# Patient Record
Sex: Male | Born: 1940 | Race: White | Hispanic: No | Marital: Married | Smoking: Former smoker
Health system: Southern US, Community
[De-identification: ages and names within clinical notes are randomized; demographics above are authoritative.]

## PROBLEM LIST (undated history)

## (undated) DIAGNOSIS — C801 Malignant (primary) neoplasm, unspecified: Secondary | ICD-10-CM

## (undated) DIAGNOSIS — I714 Abdominal aortic aneurysm, without rupture, unspecified: Secondary | ICD-10-CM

## (undated) DIAGNOSIS — I739 Peripheral vascular disease, unspecified: Secondary | ICD-10-CM

## (undated) DIAGNOSIS — I10 Essential (primary) hypertension: Secondary | ICD-10-CM

## (undated) DIAGNOSIS — I723 Aneurysm of iliac artery: Secondary | ICD-10-CM

## (undated) HISTORY — DX: Aneurysm of iliac artery: I72.3

## (undated) HISTORY — PX: SMALL INTESTINE SURGERY: SHX150

## (undated) HISTORY — DX: Essential (primary) hypertension: I10

---

## 2018-09-25 ENCOUNTER — Other Ambulatory Visit (HOSPITAL_COMMUNITY): Payer: Self-pay | Admitting: Internal Medicine

## 2018-09-25 DIAGNOSIS — I739 Peripheral vascular disease, unspecified: Secondary | ICD-10-CM

## 2018-10-01 ENCOUNTER — Ambulatory Visit (HOSPITAL_COMMUNITY)
Admission: RE | Admit: 2018-10-01 | Discharge: 2018-10-01 | Disposition: A | Discharge status: Home / Self Care | Payer: No Typology Code available for payment source | Source: Ambulatory Visit | Attending: Internal Medicine | Admitting: Internal Medicine

## 2018-10-01 DIAGNOSIS — I739 Peripheral vascular disease, unspecified: Secondary | ICD-10-CM

## 2018-10-01 DIAGNOSIS — I771 Stricture of artery: Secondary | ICD-10-CM | POA: Insufficient documentation

## 2019-01-12 ENCOUNTER — Encounter: Payer: Self-pay | Admitting: Internal Medicine

## 2019-03-16 ENCOUNTER — Ambulatory Visit: Payer: Non-veteran care | Admitting: Gastroenterology

## 2019-04-09 ENCOUNTER — Other Ambulatory Visit: Payer: Self-pay

## 2019-04-09 DIAGNOSIS — I723 Aneurysm of iliac artery: Secondary | ICD-10-CM

## 2019-04-14 ENCOUNTER — Ambulatory Visit (INDEPENDENT_AMBULATORY_CARE_PROVIDER_SITE_OTHER): Payer: No Typology Code available for payment source | Admitting: Vascular Surgery

## 2019-04-14 ENCOUNTER — Encounter: Payer: Self-pay | Admitting: Vascular Surgery

## 2019-04-14 ENCOUNTER — Other Ambulatory Visit: Payer: Self-pay | Admitting: Vascular Surgery

## 2019-04-14 ENCOUNTER — Other Ambulatory Visit: Payer: Self-pay

## 2019-04-14 ENCOUNTER — Ambulatory Visit (HOSPITAL_COMMUNITY)
Admission: RE | Admit: 2019-04-14 | Discharge: 2019-04-14 | Disposition: A | Discharge status: Home / Self Care | Payer: Non-veteran care | Source: Ambulatory Visit | Attending: Vascular Surgery | Admitting: Vascular Surgery

## 2019-04-14 DIAGNOSIS — I7102 Dissection of abdominal aorta: Secondary | ICD-10-CM

## 2019-04-14 DIAGNOSIS — I723 Aneurysm of iliac artery: Secondary | ICD-10-CM

## 2019-04-14 DIAGNOSIS — I714 Abdominal aortic aneurysm, without rupture, unspecified: Secondary | ICD-10-CM | POA: Insufficient documentation

## 2019-04-14 DIAGNOSIS — I70213 Atherosclerosis of native arteries of extremities with intermittent claudication, bilateral legs: Secondary | ICD-10-CM | POA: Diagnosis not present

## 2019-04-14 DIAGNOSIS — I70219 Atherosclerosis of native arteries of extremities with intermittent claudication, unspecified extremity: Secondary | ICD-10-CM | POA: Insufficient documentation

## 2019-04-14 DIAGNOSIS — I7100 Dissection of unspecified site of aorta: Secondary | ICD-10-CM | POA: Insufficient documentation

## 2019-04-14 NOTE — Progress Notes (Signed)
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  Patient states he has known about several aneurysms in his abdomen for years and has been followed at the [place].  He has been seen by Dr. Nicola Girt a vascular surgeon there with serial duplex imaging.   In review of records, in 2019 on duplex had ectatic infra-renal aorta at 2.7 cm, right common iliac was 2.3 cm, and left was 1.8 cm.  There was growth on duplex this year and CTA was ordered.  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  In addition he endorses bilateral lower extremity claudication symptoms.  He states he no longer smokes.  States he never had a heart attack.  States he is able to mow his grass with a riding mower.  States both of his legs hurt with claudication symptoms and he can walk about 100 feet before he has to stop.  ABI's at [facility] 0.53 right, 0.44 left.  He does take Plavix.   No abdominal or back pain.  Aneurysm is otherwise asymptomatic.    Previous abdominal surgery includes bilateral oblique abdominal incisions and he states one of these was for perforated bowel and the other was for some cancer surgery that he cannot specify.  Past Medical History:  Diagnosis Date  . Hypertension   . Iliac artery aneurysm [facility])     Past Surgical History:  Procedure Laterality Date  . SMALL INTESTINE SURGERY      Family  History  Family history unknown: Yes    SOCIAL HISTORY: Social History   Socioeconomic History  . Marital status: Unknown    Spouse name: Not on file  . Number of children: Not on file  . Years of education: Not on file  . Highest education level: Not on file  Occupational History  . Not on file  Social Needs  . Financial resource strain: Not on file  . Food insecurity:    Worry: Not on file    Inability: Not on file  . Transportation needs:    Medical: Not on file    Non-medical: Not on file  Tobacco Use  . Smoking status: Former Smoker    Types: Cigarettes    Last attempt to quit: 2010    Years since quitting: 10.4  . Smokeless tobacco: Never Used  Substance and Sexual Activity  . Alcohol use: Not Currently  . Drug use: Never  . Sexual activity: Not on file  Lifestyle  . Physical activity:    Days per week: Not on file    Minutes per session: Not on file  . Stress: Not on file  Relationships  . Social connections:    Talks on phone: Not on file    Gets together: Not on file    Attends religious service: Not on file    Active member of club or organization: Not  on file    Attends meetings of clubs or organizations: Not on file    Relationship status: Not on file  . Intimate partner violence:    Fear of current or ex partner: Not on file    Emotionally abused: Not on file    Physically abused: Not on file    Forced sexual activity: Not on file  Other Topics Concern  . Not on file  Social History Narrative  . Not on file    Allergies  Allergen Reactions  . Mevacor [Lovastatin]   . Zocor [Simvastatin]     Current Outpatient Medications  Medication Sig Dispense Refill  . clopidogrel (PLAVIX) 75 MG tablet Take 75 mg by mouth daily.    . hydrocortisone 2.5 % cream Apply topically daily.    Marland Kitchen lisinopril (ZESTRIL) 20 MG tablet Take 20 mg by mouth daily.    Marland Kitchen omeprazole (PRILOSEC) 40 MG capsule Take 40 mg by mouth daily.     No current  facility-administered medications for this visit.     REVIEW OF SYSTEMS:  [X]  denotes positive finding, [ ]  denotes negative finding Cardiac  Comments:  Chest pain or chest pressure:    Shortness of breath upon exertion:    Short of breath when lying flat:    Irregular heart rhythm:        Vascular    Pain in calf, thigh, or hip brought on by ambulation: x bilateral  Pain in feet at night that wakes you up from your sleep:     Blood clot in your veins:    Leg swelling:         Pulmonary    Oxygen at home:    Productive cough:     Wheezing:         Neurologic    Sudden weakness in arms or legs:     Sudden numbness in arms or legs:     Sudden onset of difficulty speaking or slurred speech:    Temporary loss of vision in one eye:     Problems with dizziness:         Gastrointestinal    Blood in stool:     Vomited blood:         Genitourinary    Burning when urinating:     Blood in urine:        Psychiatric    Major depression:         Hematologic    Bleeding problems:    Problems with blood clotting too easily:        Skin    Rashes or ulcers:        Constitutional    Fever or chills:      PHYSICAL EXAM: Vitals:   04/14/19 0830 04/14/19 0841  BP: (!) 172/71 (!) 171/67  Pulse: (!) 48 (!) 48  Resp: 18   Temp: (!) 97 F (36.1 C)   TempSrc: Oral   SpO2: 100%   Weight: 161 lb (73 kg)   Height: 5' 7.5" (1.715 m)     GENERAL: The patient is a well-nourished male, in no acute distress. The vital signs are documented above. CARDIAC: There is a regular rate and rhythm.  VASCULAR:  1+ bilateral radial pulses palpable 1+ bilateral femoral pulses palpable No tissue loss lower extremity DP signals bilaterally PULMONARY: There is good air exchange bilaterally without wheezing or rales. ABDOMEN: Soft and non-tender with normal pitched bowel sounds.  Bilateral oblique abdominal incisions. MUSCULOSKELETAL: There  are no major deformities or cyanosis. NEUROLOGIC:  No focal weakness or paresthesias are detected. SKIN: There are no ulcers or rashes noted. PSYCHIATRIC: The patient has a normal affect.  DATA:   I independently reviewed his CTA from the [place] that was sent by disc.  He appears to have chronic abdominal aortic dissection starting at the level of the left renal artery which is the lowest.  He has stenosis of bilateral renal arteries.  He has a small infrarenal abdominal aneurysm approximately 3 cm.  His left distal common iliac aneurysm measures about 3.4 cm and he has significant mural thrombus within the aneurysm (nearly full of thrombus) with a stenosis of his common iliac.  The aneurysm does extend into the proximal hypogastric on the left although the hypo-does appear occluded proximally (but there is a branch distally with some retrograde perfusion).  On the right he has a small 2.3 cm common iliac artery aneurysm.  Bilateral common femoral arteries are patent.  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  Assessment/Plan:  78 year old male with history of hypertension and hyperlipidemia that presents as referral from the [place] primarily for evaluation of a 3.4 cm left common iliac artery aneurysm.  Discussed that we typically offer repair for common iliac artery aneurysms greater than 3 to 3.5 cm given at that time the risk of rupture exceeds the risk of repair.  Discussed that I do think his aneurysm needs to be repaired.  He has a lot of pathologic findings on CTA including what appears to be a chronic infrarenal aortic dissection and a 3 cm infrarenal abdominal aortic aneurysm he also has a small 2.3 cm right common iliac aneurysm in addition to the larger left common iliac that is 3.4 cm.  This is all in the setting of bilateral lower extremity short distance claudication.  In review of the CT scan I do not see any endovascular options for him and I think he would require an open repair with a bifurcated graft.  I explained this to him  detail.  I think we need to get an echocardiogram to ensure that he has no findings that would prevent him from having open aortic surgery (no previous cardiac history).  I will have him come back and see me after the echocardiogram is done to talk to him and his wife in detail more about surgery to ensure that he has a good understanding.  I discussed steps of open aortic surgery to him in detail today.  I did quote him a 30% risk of a major complication and typical 7+ day hospital stay.  Very pleasant and look forward to taking care of Mr. Farrel.     Marty Heck, MD [facility] Office: [phone] Pager: [place]

## 2019-04-20 ENCOUNTER — Other Ambulatory Visit: Payer: Self-pay

## 2019-04-20 ENCOUNTER — Ambulatory Visit (HOSPITAL_COMMUNITY)
Admission: RE | Admit: 2019-04-20 | Discharge: 2019-04-20 | Disposition: A | Discharge status: Home / Self Care | Payer: Medicare Other | Source: Ambulatory Visit | Attending: Vascular Surgery | Admitting: Vascular Surgery

## 2019-04-20 DIAGNOSIS — I7102 Dissection of abdominal aorta: Secondary | ICD-10-CM | POA: Insufficient documentation

## 2019-04-20 NOTE — Progress Notes (Signed)
  Echocardiogram 2D Echocardiogram has been performed.  Ryan Jackson 04/20/2019, 2:19 PM

## 2019-04-28 ENCOUNTER — Encounter: Payer: Self-pay | Admitting: Vascular Surgery

## 2019-04-28 ENCOUNTER — Other Ambulatory Visit: Payer: Self-pay

## 2019-04-28 ENCOUNTER — Ambulatory Visit (INDEPENDENT_AMBULATORY_CARE_PROVIDER_SITE_OTHER): Payer: Self-pay | Admitting: Vascular Surgery

## 2019-04-28 ENCOUNTER — Encounter: Payer: Self-pay | Admitting: *Deleted

## 2019-04-28 VITALS — BP 173/69 | HR 51 | Temp 97.6°F | Resp 18 | Ht 67.0 in | Wt 167.0 lb

## 2019-04-28 DIAGNOSIS — I714 Abdominal aortic aneurysm, without rupture, unspecified: Secondary | ICD-10-CM

## 2019-04-28 DIAGNOSIS — I7102 Dissection of abdominal aorta: Secondary | ICD-10-CM

## 2019-04-28 DIAGNOSIS — I70213 Atherosclerosis of native arteries of extremities with intermittent claudication, bilateral legs: Secondary | ICD-10-CM

## 2019-04-28 DIAGNOSIS — I723 Aneurysm of iliac artery: Secondary | ICD-10-CM

## 2019-04-28 NOTE — Progress Notes (Signed)
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  He presents today to further discuss surgery with his wife after echocardiogram.  As previously noted, patient has known about several aneurysms in his abdomen for years and has been followed at the [place].  He has been seen by Dr. Nicola Girt a vascular surgeon there with serial duplex imaging.   In review of records, in 2019 on duplex had ectatic infra-renal aorta at 2.7 cm, right common iliac was 2.3 cm, and left was 1.8 cm.  There was growth on duplex this year and CTA was ordered.  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  In addition he endorses bilateral lower extremity claudication symptoms.  He states he no longer smokes.  States he never had a heart attack.  States he is able to mow his grass with a riding mower and can walk from the office to the parking lot without stopped.  States both of his legs hurt with claudication symptoms and he can walk about 100 feet before he has to stop.  ABI's at [facility] 0.53 right, 0.44 left.  He does take Plavix.   No abdominal or back pain on todays visit.  Aneurysm is otherwise asymptomatic.    Previous abdominal surgery includes bilateral oblique abdominal incisions and he states one of these was for suspected appendicitis and then they had to make an incision on the other side when learned he had  perforated bowel.  Past Medical History:  Diagnosis Date  . Hypertension   . Iliac artery aneurysm [facility])     Past Surgical History:  Procedure Laterality Date  . SMALL INTESTINE SURGERY      Family History  Family history unknown: Yes    SOCIAL HISTORY: Social History   Socioeconomic History  . Marital status: Unknown    Spouse name: Not on file  . Number of children: Not on file  . Years of education: Not on file  . Highest education level: Not on file  Occupational History  . Not on file  Social Needs  . Financial resource strain: Not on file  . Food insecurity    Worry: Not on file    Inability: Not on file  . Transportation needs    Medical: Not on file    Non-medical: Not on file  Tobacco Use  . Smoking status: Former Smoker    Types: Cigarettes    Quit date: 2010    Years since quitting: 10.4  . Smokeless tobacco: Never Used  Substance and Sexual Activity  . Alcohol use: Not Currently  . Drug use: Never  . Sexual activity: Not on file  Lifestyle  . Physical activity    Days per week: Not on file    Minutes per session: Not on file  . Stress: Not on file  Relationships  . Social connections  Talks on phone: Not on file    Gets together: Not on file    Attends religious service: Not on file    Active member of club or organization: Not on file    Attends meetings of clubs or organizations: Not on file    Relationship status: Not on file  . Intimate partner violence    Fear of current or ex partner: Not on file    Emotionally abused: Not on file    Physically abused: Not on file    Forced sexual activity: Not on file  Other Topics Concern  . Not on file  Social History Narrative  . Not on file    Allergies  Allergen Reactions  . Mevacor [Lovastatin]   . Zocor [Simvastatin]     Current Outpatient Medications  Medication Sig Dispense Refill  . clopidogrel (PLAVIX) 75 MG tablet Take 75 mg by mouth daily.    . hydrocortisone 2.5 % cream  Apply topically daily.    Marland Kitchen lisinopril (ZESTRIL) 20 MG tablet Take 20 mg by mouth daily.    Marland Kitchen omeprazole (PRILOSEC) 40 MG capsule Take 40 mg by mouth daily.     No current facility-administered medications for this visit.     REVIEW OF SYSTEMS:  [X]  denotes positive finding, [ ]  denotes negative finding Cardiac  Comments:  Chest pain or chest pressure:    Shortness of breath upon exertion:    Short of breath when lying flat:    Irregular heart rhythm:        Vascular    Pain in calf, thigh, or hip brought on by ambulation: x bilateral  Pain in feet at night that wakes you up from your sleep:     Blood clot in your veins:    Leg swelling:         Pulmonary    Oxygen at home:    Productive cough:     Wheezing:         Neurologic    Sudden weakness in arms or legs:     Sudden numbness in arms or legs:     Sudden onset of difficulty speaking or slurred speech:    Temporary loss of vision in one eye:     Problems with dizziness:         Gastrointestinal    Blood in stool:     Vomited blood:         Genitourinary    Burning when urinating:     Blood in urine:        Psychiatric    Major depression:         Hematologic    Bleeding problems:    Problems with blood clotting too easily:        Skin    Rashes or ulcers:        Constitutional    Fever or chills:      PHYSICAL EXAM: Vitals:   04/28/19 0913  BP: (!) 173/69  Pulse: (!) 51  Resp: 18  Temp: 97.6 F (36.4 C)  TempSrc: Temporal  SpO2: 100%  Weight: 167 lb (75.8 kg)  Height: 5\' 7"  (1.702 m)    GENERAL: The patient is a well-nourished male, in no acute distress. The vital signs are documented above. CARDIAC: There is a regular rate and rhythm.  VASCULAR:  1+ bilateral radial pulses palpable 1+ bilateral femoral pulses palpable No tissue loss lower extremity DP signals bilaterally PULMONARY: There is good air exchange bilaterally  without wheezing or rales. ABDOMEN: Soft and non-tender with  normal pitched bowel sounds.  Bilateral oblique abdominal incisions. MUSCULOSKELETAL: There are no major deformities or cyanosis. NEUROLOGIC: No focal weakness or paresthesias are detected. SKIN: There are no ulcers or rashes noted. PSYCHIATRIC: The patient has a normal affect.  DATA:   I independently reviewed his CTA from the [place] that was sent by disc.  He appears to have chronic abdominal aortic dissection starting at the level of the left renal artery which is the lowest.  He has stenosis of bilateral renal arteries.  He has a small infrarenal abdominal aneurysm approximately 3 cm.  His left distal common iliac aneurysm measures about 3.4 cm and he has significant mural thrombus within the aneurysm (nearly full of thrombus) with a stenosis of his common iliac.  The aneurysm does extend into the proximal hypogastric on the left although the hypo-does appear occluded proximally (but there is a branch distally with some retrograde perfusion).  On the right he has a small 2.3 cm common iliac artery aneurysm.  Bilateral common femoral arteries are patent.  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  Assessment/Plan:  78 year old male with history of hypertension and hyperlipidemia that presents as referral from the [place] primarily for evaluation of a 3.4 cm left common iliac artery aneurysm.  In addition, he has a chronic infrarenal aortic dissection and a 3 cm infrarenal abdominal aortic aneurysm he also has a small 2.3 cm right common iliac aneurysm in addition to the larger left common iliac that is 3.4 cm.  Discussed with him and his wife in further detail today that he has no endovascular options and requires open surgical repair.  My plan would be open aneurysm repair with a bifurcated graft likely to the left common femoral artery and right bifurcation of the external iliac and hypogastric.   Echocardiogram showed normal EF of 60-65% and no wall motion abnormalities.  He denies any  previous cardiac events.  He states no chest pain or shortness of breath.  A long discussion with him and his wife about the involved nature of this operation.  Discussed typically at least a 7-day hospital stay under normal circumstances including ICU stay.  I quoted him a greater than 30% risk of major complication including cardiac event, bleeding requiring return to the OR, renal failure ultimately needing dialysis etc.  He wants to proceed and states he has a clear understanding of the risks and benefits.  His wife wants to be present with him in the hospital during his recovery.  Discussed the current situation with COVID and [facility] not allowing visitors.  As a result we will look into July for a date when potentially the restrictions will allow her to visit.  Discussed the need to hold his Plavix 7 days before surgery and can continue baby aspirin.   Marty Heck, MD [facility] [place] Office: [phone] Pager: [place]

## 2019-05-14 ENCOUNTER — Other Ambulatory Visit: Payer: Self-pay | Admitting: *Deleted

## 2019-05-22 ENCOUNTER — Ambulatory Visit: Payer: Non-veteran care | Admitting: Gastroenterology

## 2019-05-22 ENCOUNTER — Encounter: Payer: Self-pay | Admitting: Internal Medicine

## 2019-05-22 ENCOUNTER — Telehealth: Payer: Self-pay | Admitting: Internal Medicine

## 2019-05-22 NOTE — Telephone Encounter (Signed)
Patient was a no show and letter sent  °

## 2019-05-26 NOTE — Pre-Procedure Instructions (Addendum)
Ryan Jackson  05/26/2019      [place] [place] [phone] [place] [address] Phone: [phone] Fax: [phone]    Your procedure is scheduled on July 27  Report to [facility] Entrance A at 5:30 A.M.  Call this number if you have problems the morning of surgery:  [phone]   Remember:  Do not eat or drink after midnight.      Take these medicines the morning of surgery with A SIP OF WATER :              Refresh eye drops if needed             Omeprazole (prilosec)             aspirin              7 days prior to surgery STOP taking Aleve, Naproxen, Ibuprofen, Motrin, Advil, Goody's, BC's, all herbal medications, fish oil, and all vitamins.                STOP PLAVIX July 19                  Do not wear jewelry.  Do not wear lotions, powders, or perfumes, or deodorant.  Do not shave 48 hours prior to surgery.  Men may shave face and neck.  Do not bring valuables to the hospital.  [facility] is not responsible for any belongings or valuables.  Contacts, dentures or bridgework may not be worn into surgery.  Leave your suitcase in the car.  After surgery it may be brought to your room.  For patients admitted to the hospital, discharge time will be determined by your treatment team.  Patients discharged the day of surgery will not be allowed to drive home.    Special instructions:   [place]- Preparing For Surgery  Before surgery, you can play an important role. Because skin is not sterile, your skin needs to be as free of germs as possible. You can reduce the number of germs on your skin by washing with CHG (chlorahexidine gluconate) Soap before surgery.  CHG is an antiseptic cleaner which kills germs and bonds with the skin to continue killing germs even after washing.    Oral Hygiene is also important to reduce your risk of infection.  Remember - BRUSH  YOUR TEETH THE MORNING OF SURGERY WITH YOUR REGULAR TOOTHPASTE  Please do not use if you have an allergy to CHG or antibacterial soaps. If your skin becomes reddened/irritated stop using the CHG.  Do not shave (including legs and underarms) for at least 48 hours prior to first CHG shower. It is OK to shave your face.  Please follow these instructions carefully.   1. Shower the NIGHT BEFORE SURGERY and the MORNING OF SURGERY with CHG.   2. If you chose to wash your hair, wash your hair first as usual with your normal shampoo.  3. After you shampoo, rinse your hair and body thoroughly to remove the shampoo.  4. Use CHG as you would any other liquid soap. You can apply CHG directly to the skin and wash gently with a scrungie or a clean washcloth.   5. Apply the CHG Soap to your body ONLY FROM THE NECK DOWN.  Do not use on open wounds or open sores. Avoid contact with your eyes, ears, mouth and genitals (private parts). Wash Face  and genitals (private parts)  with your normal soap.  6. Wash thoroughly, paying special attention to the area where your surgery will be performed.  7. Thoroughly rinse your body with warm water from the neck down.  8. DO NOT shower/wash with your normal soap after using and rinsing off the CHG Soap.  9. Pat yourself dry with a CLEAN TOWEL.  10. Wear CLEAN PAJAMAS to bed the night before surgery, wear comfortable clothes the morning of surgery  11. Place CLEAN SHEETS on your bed the night of your first shower and DO NOT SLEEP WITH PETS.    Day of Surgery:  Do not apply any deodorants/lotions.  Please wear clean clothes to the hospital/surgery center.   Remember to brush your teeth WITH YOUR REGULAR TOOTHPASTE.    Please read over the following fact sheets that you were given. Coughing and Deep Breathing, MRSA Information and Surgical Site Infection Prevention

## 2019-05-27 ENCOUNTER — Other Ambulatory Visit: Payer: Self-pay

## 2019-05-27 ENCOUNTER — Encounter (HOSPITAL_COMMUNITY)
Admission: RE | Admit: 2019-05-27 | Discharge: 2019-05-27 | Disposition: A | Discharge status: Home / Self Care | Payer: Medicare Other | Source: Ambulatory Visit | Attending: Vascular Surgery | Admitting: Vascular Surgery

## 2019-05-27 ENCOUNTER — Encounter (HOSPITAL_COMMUNITY): Payer: Self-pay

## 2019-05-27 DIAGNOSIS — Z01818 Encounter for other preprocedural examination: Secondary | ICD-10-CM | POA: Diagnosis present

## 2019-05-27 HISTORY — DX: Peripheral vascular disease, unspecified: I73.9

## 2019-05-27 HISTORY — DX: Abdominal aortic aneurysm, without rupture: I71.4

## 2019-05-27 HISTORY — DX: Malignant (primary) neoplasm, unspecified: C80.1

## 2019-05-27 HISTORY — DX: Abdominal aortic aneurysm, without rupture, unspecified: I71.40

## 2019-05-27 LAB — URINALYSIS, ROUTINE W REFLEX MICROSCOPIC
Bilirubin Urine: NEGATIVE
Glucose, UA: NEGATIVE mg/dL
Hgb urine dipstick: NEGATIVE
Ketones, ur: NEGATIVE mg/dL
Leukocytes,Ua: NEGATIVE
Nitrite: NEGATIVE
Protein, ur: NEGATIVE mg/dL
Specific Gravity, Urine: 1.02 (ref 1.005–1.030)
pH: 5 (ref 5.0–8.0)

## 2019-05-27 LAB — CBC
HCT: 42.9 % (ref 39.0–52.0)
Hemoglobin: 12.9 g/dL — ABNORMAL LOW (ref 13.0–17.0)
MCH: 26.4 pg (ref 26.0–34.0)
MCHC: 30.1 g/dL (ref 30.0–36.0)
MCV: 87.9 fL (ref 80.0–100.0)
Platelets: 149 10*3/uL — ABNORMAL LOW (ref 150–400)
RBC: 4.88 MIL/uL (ref 4.22–5.81)
RDW: 21.7 % — ABNORMAL HIGH (ref 11.5–15.5)
WBC: 6.4 10*3/uL (ref 4.0–10.5)
nRBC: 0 % (ref 0.0–0.2)

## 2019-05-27 LAB — BLOOD GAS, ARTERIAL
Acid-Base Excess: 0.2 mmol/L (ref 0.0–2.0)
Bicarbonate: 24.5 mmol/L (ref 20.0–28.0)
Drawn by: 421801
FIO2: 21
O2 Saturation: 96.6 %
Patient temperature: 98.6
pCO2 arterial: 41.1 mmHg (ref 32.0–48.0)
pH, Arterial: 7.393 (ref 7.350–7.450)
pO2, Arterial: 87.6 mmHg (ref 83.0–108.0)

## 2019-05-27 LAB — PREPARE RBC (CROSSMATCH)

## 2019-05-27 LAB — PROTIME-INR
INR: 1 (ref 0.8–1.2)
Prothrombin Time: 13.3 seconds (ref 11.4–15.2)

## 2019-05-27 LAB — COMPREHENSIVE METABOLIC PANEL
ALT: 11 U/L (ref 0–44)
AST: 21 U/L (ref 15–41)
Albumin: 4.2 g/dL (ref 3.5–5.0)
Alkaline Phosphatase: 101 U/L (ref 38–126)
Anion gap: 9 (ref 5–15)
BUN: 19 mg/dL (ref 8–23)
CO2: 22 mmol/L (ref 22–32)
Calcium: 9.2 mg/dL (ref 8.9–10.3)
Chloride: 106 mmol/L (ref 98–111)
Creatinine, Ser: 1.09 mg/dL (ref 0.61–1.24)
GFR calc Af Amer: >60 mL/min (ref >60)
GFR calc non Af Amer: >60 mL/min (ref >60)
Glucose, Bld: 100 mg/dL — ABNORMAL HIGH (ref 70–99)
Potassium: 4.3 mmol/L (ref 3.5–5.1)
Sodium: 137 mmol/L (ref 135–145)
Total Bilirubin: 1 mg/dL (ref 0.3–1.2)
Total Protein: 7.2 g/dL (ref 6.5–8.1)

## 2019-05-27 LAB — SURGICAL PCR SCREEN
MRSA, PCR: NEGATIVE
Staphylococcus aureus: NEGATIVE

## 2019-05-27 LAB — APTT: aPTT: 34 seconds (ref 24–36)

## 2019-05-27 LAB — ABO/RH: ABO/RH(D): O POS

## 2019-05-27 NOTE — Progress Notes (Signed)
PCP - [facility] Cardiologist - na  Chest x-ray - na EKG - today Stress Test - na ECHO - na Cardiac Cath - na  Sleep Study - na CPAP -   Fasting Blood Sugar - na Checks Blood Sugar _____ times a day  Blood Thinner Instructions: plavix stop 7/19 Aspirin Instructions: continue and take dos  Pt. Having covid testing  July 24 @ [facility]  Anesthesia review:   Patient denies shortness of breath, fever, cough and chest pain at PAT appointment   Patient verbalized understanding of instructions that were given to them at the PAT appointment. Patient was also instructed that they will need to review over the PAT instructions again at home before surgery.

## 2019-05-29 ENCOUNTER — Other Ambulatory Visit (HOSPITAL_COMMUNITY): Admission: RE | Admit: 2019-05-29 | Payer: Non-veteran care | Source: Ambulatory Visit

## 2019-05-29 ENCOUNTER — Encounter (HOSPITAL_COMMUNITY): Payer: Self-pay

## 2019-05-29 NOTE — Anesthesia Preprocedure Evaluation (Addendum)
Anesthesia Evaluation  Patient identified by MRN, date of birth, ID band Patient awake    Reviewed: Allergy & Precautions, NPO status , Patient's Chart, lab work & pertinent test results  Airway Mallampati: I  TM Distance: >3 FB Neck ROM: Full    Dental   Pulmonary former smoker,    Pulmonary exam normal        Cardiovascular hypertension, Pt. on medications Normal cardiovascular exam     Neuro/Psych    GI/Hepatic   Endo/Other    Renal/GU      Musculoskeletal   Abdominal   Peds  Hematology   Anesthesia Other Findings   Reproductive/Obstetrics                            Anesthesia Physical Anesthesia Plan  ASA: III  Anesthesia Plan: General   Post-op Pain Management:    Induction:   PONV Risk Score and Plan: 2 and Ondansetron and Treatment may vary due to age or medical condition  Airway Management Planned: Oral ETT  Additional Equipment: Arterial line, CVP, PA Cath and Ultrasound Guidance Line Placement  Intra-op Plan:   Post-operative Plan: Possible Post-op intubation/ventilation  Informed Consent: I have reviewed the patients History and Physical, chart, labs and discussed the procedure including the risks, benefits and alternatives for the proposed anesthesia with the patient or authorized representative who has indicated his/her understanding and acceptance.       Plan Discussed with: CRNA and Surgeon  Anesthesia Plan Comments: (See PAT note written 05/29/2019 by Myra Gianotti, PA-C. )       Anesthesia Quick Evaluation

## 2019-05-29 NOTE — Progress Notes (Signed)
Anesthesia Chart Review:  Case: [id number] Date/Time: 06/08/19 0715   Procedure: ANEURYSM ABDOMINAL AORTIC REPAIR (N/A )   Anesthesia type: General   Pre-op diagnosis: abdominal aortic aneurysm   Location: [facility] OR ROOM 12 / [place] [facility]   Surgeon: Marty Heck, MD      DISCUSSION: Patient is a 78 year old male scheduled for the above procedure. 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 He is not a candidate for endovascular approach given his anatomy. Dr. Carlis Abbott ordered a preoperative echo that showed normal LVF.   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   In review of his vitals and EKG, HR has been 47-59 BPM in [facility] since 04/14/19. Denied chest pain and SOB.  [facility] ROS negative other than claudication. No dizziness. There is no evidence of heart block on EKG. He is not on any rate-lowering medications. Will need close monitoring of his HR perioperatively. I have sent a message to Dr. Carlis Abbott regarding this in case he has any additional recommendations.  Per Dr. Carlis Abbott, hold Plavix for 7 days prior to surgery (last dose 05/31/19), but can continue ASA 81 mg. Presurgical COVID-19 test is scheduled for 06/05/19.   VS: BP (!) 159/69   Pulse (!) 59   Temp (!) 36.3 C   Resp 18   Ht 5\' 7"  (1.702 m)   Wt 74.5 kg   SpO2 99%   BMI 25.73 kg/m  HR 47-59 BPM on VS, EKGs in [facility] since 04/14/19.     PROVIDERS: He receives primary care through the [facility].   LABS: Labs reviewed: Acceptable for surgery. (all labs ordered are listed, but only abnormal results are displayed)  Labs Reviewed  CBC - Abnormal; Notable for the following  components:      Result Value   Hemoglobin 12.9 (*)    RDW 21.7 (*)    Platelets 149 (*)    All other components within normal limits  COMPREHENSIVE METABOLIC PANEL - Abnormal; Notable for the following components:   Glucose, Bld 100 (*)    All other components within normal limits  SURGICAL PCR SCREEN  APTT  BLOOD GAS, ARTERIAL  PROTIME-INR  URINALYSIS, ROUTINE W REFLEX MICROSCOPIC  PREPARE RBC (CROSSMATCH)  TYPE AND SCREEN  ABO/RH     IMAGES: [place] AAA Duplex 04/14/19: Summary: Abdominal Aorta: There is evidence of abnormal dilitation of the Distal Abdominal aorta. The largest aortic measurement is 3.1 cm.  There is evidence of abnormal dilation of the Right Common Iliac artery (largest measurement is 2.4 cm), Left Common Iliac artery (largest measurement of 2.2 cm), and Left External Iliac artery (largest measurement of 3.5 cm).  Significant thrombus is noted in the left common iliac and external iliac.   Mid aorta < 50% stenosis. No previous exam available for comparison.  CTA aorta with BLE runoff 02/23/19 [facility], read by Edgardo Roys, MD): Impression: 1.  Nonvascular: Findings most suspicious for peptic ulcer disease of the gastric pylorus or focal gastritis.  A gastric neoplasm or cholecystitis are differential considerations, but thought to be less likely.  No clear evidence of perforation.  Consider GI consultation. 2.  Abdominal aorta: Focal  dissection of the proximal infrarenal segment causing moderate stenosis.  Fusiform 3.0 x 2.5 cm aneurysm of the mid infrarenal.  Diffuse moderate atherosclerosis. 3.  Left leg: 3.4 x 3.0 cm eccentric/saccular aneurysm of the distal common iliac and proximal internal iliac.  It is not clear to me that this was within the area imaged on the prior 11/15/2017 ultrasound.  Aneurysm sac is predominantly thrombosed.  No surrounding fluid or fat stranding.  Common iliac is severely stenotic at the aneurysm.  Internal iliac is occluded proximally.   Remainder of the above-knee leg is severely diseased with focal severe stenosis in the external iliac, 13 cm long occlusion of the proximal SFA with severely stenotic distal SFA, and moderate disease in the above-the-knee popliteal.  Likely two-vessel runoff to the foot. 4.  Right leg: Stable 2.3 x 2.1 cm mild common iliac aneurysm.  Severely stenotic SFA with a few short segments of interspersed mild disease.  Moderate disease in the popliteal and severe disease in the tibioperoneal trunk.  Two-vessel runoff to the foot. 5.  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  Complete Abdominal Series + CHEST 12/23/18 [facility]): Findings:  The cardiomediastinal silhouette is within normal limits.  Bilateral nipple shadows.  Likely focal eventration of the right hemidiaphragm.  No pulmonary edema.  No large pleural effusion.  No definite focal lung consolidation seen.  No free air underlying the hemidiaphragms.  Rightward scoliotic curvature of the lumbar spine.  No dilated loops of the air-filled bowel to suggest a bowel obstruction.  Stool in the right colon, transverse colon, left colon, and rectum.  No definite large calcification seen projecting over the regions of the kidney, ureters, or urinary bladder.  Vascular calcifications. Impression: 1.  No definite acute cardiopulmonary abnormality identified. 2.  No dilated loops of air-filled bowel to suggest a bowel obstruction.  3.  No definite free intraperitoneal air seen. 4.  Consider CT of the abdomen pelvis for further evaluation as clinically warranted.   EKG: 05/27/19: Sinus bradycardia at 47 BPM Borderline ECG Confirmed by Kathlyn Sacramento [phone]) on 05/28/2019 7:35:09 AM   CV: Echo 04/20/19: IMPRESSIONS  1. The left ventricle has normal systolic function with an ejection fraction of 60-65%. The cavity size was normal. Left ventricular diastolic parameters were normal. No evidence of left ventricular regional  wall motion abnormalities.  2. The right ventricle has normal systolic function. The cavity was normal. There is no increase in right ventricular wall thickness.  3. Left atrial size was mildly dilated.  4. The interatrial septum appears to be lipomatous.    Past Medical History:  Diagnosis Date  . AAA (abdominal aortic aneurysm) (HCC)    3.0 cm with focal dissection at proximal infrarenal segment with moderate stenosis 02/2019  . Cancer ([place])    SKIN CNACER ON BACK  . Hypertension   . Iliac artery aneurysm ([place])   . PAD (peripheral artery disease) ([place])     Past Surgical History:  Procedure Laterality Date  . SMALL INTESTINE SURGERY      MEDICATIONS: . aspirin EC 81 MG tablet  . carboxymethylcellulose (REFRESH PLUS) 0.5 % SOLN  . clopidogrel (PLAVIX) 75 MG tablet  . diphenhydramine-acetaminophen (TYLENOL PM) 25-500 MG TABS tablet  . ferrous sulfate 325 (65 FE) MG tablet  . hydrocortisone 2.5 % cream  . lisinopril (ZESTRIL) 20 MG tablet  . naproxen sodium (ALEVE) 220 MG tablet  . omeprazole (PRILOSEC) 40 MG capsule   No current facility-administered  medications for this encounter.     Myra Gianotti, PA-C Surgical Short Stay/Anesthesiology [facility] Phone [phone] [facility] Phone [phone] 05/29/2019 6:30 PM

## 2019-06-05 ENCOUNTER — Other Ambulatory Visit (HOSPITAL_COMMUNITY)
Admission: RE | Admit: 2019-06-05 | Discharge: 2019-06-05 | Disposition: A | Discharge status: Home / Self Care | Payer: Medicare Other | Source: Ambulatory Visit | Attending: Vascular Surgery | Admitting: Vascular Surgery

## 2019-06-05 ENCOUNTER — Other Ambulatory Visit: Payer: Self-pay

## 2019-06-05 DIAGNOSIS — Z1159 Encounter for screening for other viral diseases: Secondary | ICD-10-CM | POA: Insufficient documentation

## 2019-06-06 LAB — SARS CORONAVIRUS 2 (TAT 6-24 HRS): SARS Coronavirus 2: NEGATIVE

## 2019-06-08 ENCOUNTER — Inpatient Hospital Stay (HOSPITAL_COMMUNITY): Payer: No Typology Code available for payment source

## 2019-06-08 ENCOUNTER — Inpatient Hospital Stay (HOSPITAL_COMMUNITY)
Admission: RE | Admit: 2019-06-08 | Discharge: 2019-06-16 | DRG: 270 | Disposition: A | Discharge status: Home Health | Payer: No Typology Code available for payment source | Attending: Vascular Surgery | Admitting: Vascular Surgery

## 2019-06-08 ENCOUNTER — Inpatient Hospital Stay (HOSPITAL_COMMUNITY): Payer: No Typology Code available for payment source | Admitting: Vascular Surgery

## 2019-06-08 ENCOUNTER — Other Ambulatory Visit: Payer: Self-pay

## 2019-06-08 ENCOUNTER — Inpatient Hospital Stay (HOSPITAL_COMMUNITY): Payer: No Typology Code available for payment source | Admitting: Anesthesiology

## 2019-06-08 ENCOUNTER — Encounter (HOSPITAL_COMMUNITY)
Admission: RE | Disposition: A | Discharge status: Home Health | Payer: Self-pay | Source: Home / Self Care | Attending: Vascular Surgery

## 2019-06-08 ENCOUNTER — Encounter (HOSPITAL_COMMUNITY): Payer: Self-pay | Admitting: Certified Registered Nurse Anesthetist

## 2019-06-08 DIAGNOSIS — I7102 Dissection of abdominal aorta: Secondary | ICD-10-CM

## 2019-06-08 DIAGNOSIS — D62 Acute posthemorrhagic anemia: Secondary | ICD-10-CM | POA: Diagnosis not present

## 2019-06-08 DIAGNOSIS — E872 Acidosis: Secondary | ICD-10-CM | POA: Diagnosis not present

## 2019-06-08 DIAGNOSIS — I714 Abdominal aortic aneurysm, without rupture, unspecified: Secondary | ICD-10-CM

## 2019-06-08 DIAGNOSIS — T8119XA Other postprocedural shock, initial encounter: Secondary | ICD-10-CM | POA: Diagnosis not present

## 2019-06-08 DIAGNOSIS — N179 Acute kidney failure, unspecified: Secondary | ICD-10-CM | POA: Diagnosis not present

## 2019-06-08 DIAGNOSIS — I70213 Atherosclerosis of native arteries of extremities with intermittent claudication, bilateral legs: Secondary | ICD-10-CM | POA: Diagnosis present

## 2019-06-08 DIAGNOSIS — I7100 Dissection of unspecified site of aorta: Secondary | ICD-10-CM | POA: Diagnosis present

## 2019-06-08 DIAGNOSIS — I723 Aneurysm of iliac artery: Secondary | ICD-10-CM | POA: Diagnosis present

## 2019-06-08 DIAGNOSIS — E785 Hyperlipidemia, unspecified: Secondary | ICD-10-CM | POA: Diagnosis present

## 2019-06-08 DIAGNOSIS — K660 Peritoneal adhesions (postprocedural) (postinfection): Secondary | ICD-10-CM | POA: Diagnosis present

## 2019-06-08 DIAGNOSIS — I480 Paroxysmal atrial fibrillation: Secondary | ICD-10-CM | POA: Diagnosis not present

## 2019-06-08 DIAGNOSIS — Z87891 Personal history of nicotine dependence: Secondary | ICD-10-CM

## 2019-06-08 DIAGNOSIS — I10 Essential (primary) hypertension: Secondary | ICD-10-CM | POA: Diagnosis present

## 2019-06-08 DIAGNOSIS — Z888 Allergy status to other drugs, medicaments and biological substances status: Secondary | ICD-10-CM

## 2019-06-08 DIAGNOSIS — Z8679 Personal history of other diseases of the circulatory system: Secondary | ICD-10-CM

## 2019-06-08 DIAGNOSIS — F05 Delirium due to known physiological condition: Secondary | ICD-10-CM | POA: Diagnosis not present

## 2019-06-08 DIAGNOSIS — Z419 Encounter for procedure for purposes other than remedying health state, unspecified: Secondary | ICD-10-CM

## 2019-06-08 HISTORY — PX: ABDOMINAL AORTIC ANEURYSM REPAIR: SHX42

## 2019-06-08 HISTORY — PX: LYSIS OF ADHESION: SHX5961

## 2019-06-08 LAB — BASIC METABOLIC PANEL
Anion gap: 14 (ref 5–15)
BUN: 15 mg/dL (ref 8–23)
CO2: 17 mmol/L — ABNORMAL LOW (ref 22–32)
Calcium: 6.8 mg/dL — ABNORMAL LOW (ref 8.9–10.3)
Chloride: 110 mmol/L (ref 98–111)
Creatinine, Ser: 1.24 mg/dL (ref 0.61–1.24)
GFR calc Af Amer: >60 mL/min (ref >60)
GFR calc non Af Amer: 55 mL/min — ABNORMAL LOW (ref >60)
Glucose, Bld: 209 mg/dL — ABNORMAL HIGH (ref 70–99)
Potassium: 4.4 mmol/L (ref 3.5–5.1)
Sodium: 141 mmol/L (ref 135–145)

## 2019-06-08 LAB — POCT I-STAT 7, (LYTES, BLD GAS, ICA,H+H)
Acid-base deficit: 13 mmol/L — ABNORMAL HIGH (ref 0.0–2.0)
Acid-base deficit: 8 mmol/L — ABNORMAL HIGH (ref 0.0–2.0)
Bicarbonate: 15.3 mmol/L — ABNORMAL LOW (ref 20.0–28.0)
Bicarbonate: 18.6 mmol/L — ABNORMAL LOW (ref 20.0–28.0)
Calcium, Ion: 1 mmol/L — ABNORMAL LOW (ref 1.15–1.40)
Calcium, Ion: 1.01 mmol/L — ABNORMAL LOW (ref 1.15–1.40)
HCT: 31 % — ABNORMAL LOW (ref 39.0–52.0)
HCT: 27 % — ABNORMAL LOW (ref 39.0–52.0)
Hemoglobin: 10.5 g/dL — ABNORMAL LOW (ref 13.0–17.0)
Hemoglobin: 9.2 g/dL — ABNORMAL LOW (ref 13.0–17.0)
O2 Saturation: 97 %
O2 Saturation: 95 %
Patient temperature: 33.9
Patient temperature: 36.2
Potassium: 3.8 mmol/L (ref 3.5–5.1)
Potassium: 3.5 mmol/L (ref 3.5–5.1)
Sodium: 144 mmol/L (ref 135–145)
Sodium: 145 mmol/L (ref 135–145)
TCO2: 17 mmol/L — ABNORMAL LOW (ref 22–32)
TCO2: 20 mmol/L — ABNORMAL LOW (ref 22–32)
pCO2 arterial: 36.8 mmHg (ref 32.0–48.0)
pCO2 arterial: 39.2 mmHg (ref 32.0–48.0)
pH, Arterial: 7.209 — ABNORMAL LOW (ref 7.350–7.450)
pH, Arterial: 7.279 — ABNORMAL LOW (ref 7.350–7.450)
pO2, Arterial: 101 mmHg (ref 83.0–108.0)
pO2, Arterial: 84 mmHg (ref 83.0–108.0)

## 2019-06-08 LAB — BLOOD GAS, ARTERIAL
Acid-base deficit: 7.2 mmol/L — ABNORMAL HIGH (ref 0.0–2.0)
Bicarbonate: 18.6 mmol/L — ABNORMAL LOW (ref 20.0–28.0)
O2 Content: 3 L/min
O2 Saturation: 98 %
Patient temperature: 98.6
pCO2 arterial: 42.3 mmHg (ref 32.0–48.0)
pH, Arterial: 7.265 — ABNORMAL LOW (ref 7.350–7.450)
pO2, Arterial: 133 mmHg — ABNORMAL HIGH (ref 83.0–108.0)

## 2019-06-08 LAB — MAGNESIUM: Magnesium: 1.7 mg/dL (ref 1.7–2.4)

## 2019-06-08 LAB — CBC
HCT: 36 % — ABNORMAL LOW (ref 39.0–52.0)
HCT: 27.7 % — ABNORMAL LOW (ref 39.0–52.0)
Hemoglobin: 11.1 g/dL — ABNORMAL LOW (ref 13.0–17.0)
Hemoglobin: 8.8 g/dL — ABNORMAL LOW (ref 13.0–17.0)
MCH: 28.1 pg (ref 26.0–34.0)
MCH: 28.4 pg (ref 26.0–34.0)
MCHC: 30.8 g/dL (ref 30.0–36.0)
MCHC: 31.8 g/dL (ref 30.0–36.0)
MCV: 91.1 fL (ref 80.0–100.0)
MCV: 89.4 fL (ref 80.0–100.0)
Platelets: 77 10*3/uL — ABNORMAL LOW (ref 150–400)
Platelets: 99 10*3/uL — ABNORMAL LOW (ref 150–400)
RBC: 3.95 MIL/uL — ABNORMAL LOW (ref 4.22–5.81)
RBC: 3.1 MIL/uL — ABNORMAL LOW (ref 4.22–5.81)
RDW: 19.1 % — ABNORMAL HIGH (ref 11.5–15.5)
RDW: 19.6 % — ABNORMAL HIGH (ref 11.5–15.5)
WBC: 11.8 10*3/uL — ABNORMAL HIGH (ref 4.0–10.5)
WBC: 10.5 10*3/uL (ref 4.0–10.5)
nRBC: 0 % (ref 0.0–0.2)
nRBC: 0 % (ref 0.0–0.2)

## 2019-06-08 LAB — PROTIME-INR
INR: 2.1 — ABNORMAL HIGH (ref 0.8–1.2)
INR: 1.5 — ABNORMAL HIGH (ref 0.8–1.2)
Prothrombin Time: 23.2 seconds — ABNORMAL HIGH (ref 11.4–15.2)
Prothrombin Time: 17.7 seconds — ABNORMAL HIGH (ref 11.4–15.2)

## 2019-06-08 LAB — APTT: aPTT: 62 seconds — ABNORMAL HIGH (ref 24–36)

## 2019-06-08 LAB — LACTIC ACID, PLASMA: Lactic Acid, Venous: 9.9 mmol/L (ref 0.5–1.9)

## 2019-06-08 SURGERY — ANEURYSM ABDOMINAL AORTIC REPAIR
Anesthesia: General

## 2019-06-08 MED ORDER — CEFAZOLIN SODIUM-DEXTROSE 2-4 GM/100ML-% IV SOLN
2.0000 g | INTRAVENOUS | Status: AC
  Administered 2019-06-08 (×2): 2 g via INTRAVENOUS
  Filled 2019-06-08: qty 100

## 2019-06-08 MED ORDER — ROCURONIUM BROMIDE 50 MG/5ML IV SOSY
PREFILLED_SYRINGE | INTRAVENOUS | Status: DC | PRN
  Administered 2019-06-08: 20 mg via INTRAVENOUS
  Administered 2019-06-08: 80 mg via INTRAVENOUS
  Administered 2019-06-08: 10 mg via INTRAVENOUS
  Administered 2019-06-08 (×2): 20 mg via INTRAVENOUS

## 2019-06-08 MED ORDER — EPHEDRINE SULFATE 50 MG/ML IJ SOLN
INTRAMUSCULAR | Status: DC | PRN
  Administered 2019-06-08 (×2): 10 mg via INTRAVENOUS
  Administered 2019-06-08: 5 mg via INTRAVENOUS
  Administered 2019-06-08: 10 mg via INTRAVENOUS
  Administered 2019-06-08: 5 mg via INTRAVENOUS
  Administered 2019-06-08: 10 mg via INTRAVENOUS
  Administered 2019-06-08 (×2): 5 mg via INTRAVENOUS
  Administered 2019-06-08: 10 mg via INTRAVENOUS
  Administered 2019-06-08 (×3): 5 mg via INTRAVENOUS
  Administered 2019-06-08 (×2): 10 mg via INTRAVENOUS
  Administered 2019-06-08: 5 mg via INTRAVENOUS
  Administered 2019-06-08: 10 mg via INTRAVENOUS
  Administered 2019-06-08: 5 mg via INTRAVENOUS
  Administered 2019-06-08: 10 mg via INTRAVENOUS

## 2019-06-08 MED ORDER — PHENYLEPHRINE HCL (PRESSORS) 10 MG/ML IV SOLN
INTRAVENOUS | Status: DC | PRN
  Administered 2019-06-08: 80 ug via INTRAVENOUS

## 2019-06-08 MED ORDER — HEPARIN SODIUM (PORCINE) 1000 UNIT/ML IJ SOLN
INTRAMUSCULAR | Status: DC | PRN
  Administered 2019-06-08: 8000 [IU] via INTRAVENOUS
  Administered 2019-06-08 (×2): 2000 [IU] via INTRAVENOUS

## 2019-06-08 MED ORDER — FUROSEMIDE 10 MG/ML IJ SOLN
INTRAMUSCULAR | Status: DC | PRN
  Administered 2019-06-08: 10 mg via INTRAMUSCULAR

## 2019-06-08 MED ORDER — SODIUM CHLORIDE 0.9% IV SOLUTION
Freq: Once | INTRAVENOUS | Status: AC
  Administered 2019-06-08: 18:00:00 via INTRAVENOUS

## 2019-06-08 MED ORDER — PANTOPRAZOLE SODIUM 40 MG PO TBEC
40.0000 mg | DELAYED_RELEASE_TABLET | Freq: Every day | ORAL | Status: DC
  Administered 2019-06-12 – 2019-06-15 (×4): 40 mg via ORAL
  Filled 2019-06-08 (×4): qty 1

## 2019-06-08 MED ORDER — SODIUM CHLORIDE 0.9% IV SOLUTION: Freq: Once | INTRAVENOUS | Status: DC

## 2019-06-08 MED ORDER — PANTOPRAZOLE SODIUM 40 MG IV SOLR
40.0000 mg | Freq: Every day | INTRAVENOUS | Status: DC
  Administered 2019-06-08 – 2019-06-11 (×4): 40 mg via INTRAVENOUS
  Filled 2019-06-08 (×4): qty 40

## 2019-06-08 MED ORDER — HEPARIN SODIUM (PORCINE) 1000 UNIT/ML IJ SOLN
INTRAMUSCULAR | Status: AC
  Filled 2019-06-08: qty 3

## 2019-06-08 MED ORDER — PHENYLEPHRINE 40 MCG/ML (10ML) SYRINGE FOR IV PUSH (FOR BLOOD PRESSURE SUPPORT)
PREFILLED_SYRINGE | INTRAVENOUS | Status: AC
  Filled 2019-06-08: qty 10

## 2019-06-08 MED ORDER — PHENOL 1.4 % MT LIQD: 1.0000 | OROMUCOSAL | Status: DC | PRN

## 2019-06-08 MED ORDER — LACTATED RINGERS IV SOLN
INTRAVENOUS | Status: DC | PRN
  Administered 2019-06-08 (×4): via INTRAVENOUS

## 2019-06-08 MED ORDER — ONDANSETRON HCL 4 MG/2ML IJ SOLN
INTRAMUSCULAR | Status: DC | PRN
  Administered 2019-06-08: 4 mg via INTRAVENOUS

## 2019-06-08 MED ORDER — ONDANSETRON HCL 4 MG/2ML IJ SOLN: 4.0000 mg | Freq: Four times a day (QID) | INTRAMUSCULAR | Status: DC | PRN

## 2019-06-08 MED ORDER — MANNITOL 25 % IV SOLN
INTRAVENOUS | Status: DC | PRN
  Administered 2019-06-08: 25 g via INTRAVENOUS

## 2019-06-08 MED ORDER — 0.9 % SODIUM CHLORIDE (POUR BTL) OPTIME
TOPICAL | Status: DC | PRN
  Administered 2019-06-08: 09:00:00 3000 mL

## 2019-06-08 MED ORDER — LABETALOL HCL 5 MG/ML IV SOLN: 10.0000 mg | INTRAVENOUS | Status: DC | PRN

## 2019-06-08 MED ORDER — CHLORHEXIDINE GLUCONATE CLOTH 2 % EX PADS: 6.0000 | MEDICATED_PAD | Freq: Once | CUTANEOUS | Status: DC

## 2019-06-08 MED ORDER — BISACODYL 10 MG RE SUPP: 10.0000 mg | Freq: Every day | RECTAL | Status: DC | PRN

## 2019-06-08 MED ORDER — PROTAMINE SULFATE 10 MG/ML IV SOLN
INTRAVENOUS | Status: DC | PRN
  Administered 2019-06-08: 20 mg via INTRAVENOUS
  Administered 2019-06-08: 10 mg via INTRAVENOUS

## 2019-06-08 MED ORDER — SODIUM BICARBONATE 8.4 % IV SOLN
INTRAVENOUS | Status: AC
  Administered 2019-06-08: 18:00:00 100 meq via INTRAVENOUS
  Filled 2019-06-08: qty 100

## 2019-06-08 MED ORDER — SODIUM CHLORIDE 0.9 % IV BOLUS: 1000.0000 mL | Freq: Once | INTRAVENOUS | Status: DC

## 2019-06-08 MED ORDER — ACETAMINOPHEN 325 MG PO TABS
325.0000 mg | ORAL_TABLET | ORAL | Status: DC | PRN
  Administered 2019-06-14: 650 mg via ORAL
  Filled 2019-06-08: qty 2

## 2019-06-08 MED ORDER — ORAL CARE MOUTH RINSE
15.0000 mL | Freq: Two times a day (BID) | OROMUCOSAL | Status: DC
  Administered 2019-06-08 – 2019-06-16 (×15): 15 mL via OROMUCOSAL

## 2019-06-08 MED ORDER — HYDROCORTISONE 2.5 % EX CREA: 1.0000 "application " | TOPICAL_CREAM | Freq: Every day | CUTANEOUS | Status: DC | PRN

## 2019-06-08 MED ORDER — MIDAZOLAM HCL 2 MG/2ML IJ SOLN
INTRAMUSCULAR | Status: AC
  Filled 2019-06-08: qty 2

## 2019-06-08 MED ORDER — EPHEDRINE 5 MG/ML INJ
INTRAVENOUS | Status: AC
  Filled 2019-06-08: qty 10

## 2019-06-08 MED ORDER — PROPOFOL 10 MG/ML IV BOLUS
INTRAVENOUS | Status: DC | PRN
  Administered 2019-06-08: 140 mg via INTRAVENOUS

## 2019-06-08 MED ORDER — ONDANSETRON HCL 4 MG/2ML IJ SOLN: 4.0000 mg | Freq: Once | INTRAMUSCULAR | Status: DC | PRN

## 2019-06-08 MED ORDER — ROCURONIUM BROMIDE 10 MG/ML (PF) SYRINGE
PREFILLED_SYRINGE | INTRAVENOUS | Status: AC
  Filled 2019-06-08: qty 40

## 2019-06-08 MED ORDER — POLYETHYLENE GLYCOL 3350 17 G PO PACK: 17.0000 g | PACK | Freq: Every day | ORAL | Status: DC | PRN

## 2019-06-08 MED ORDER — SODIUM CHLORIDE 0.9 % IV SOLN
500.0000 mL | Freq: Once | INTRAVENOUS | Status: AC | PRN
  Administered 2019-06-08: 500 mL via INTRAVENOUS

## 2019-06-08 MED ORDER — LACTATED RINGERS IV SOLN
INTRAVENOUS | Status: DC | PRN
  Administered 2019-06-08 (×2): via INTRAVENOUS

## 2019-06-08 MED ORDER — DOCUSATE SODIUM 100 MG PO CAPS
100.0000 mg | ORAL_CAPSULE | Freq: Every day | ORAL | Status: DC
  Administered 2019-06-11 – 2019-06-16 (×4): 100 mg via ORAL
  Filled 2019-06-08 (×4): qty 1

## 2019-06-08 MED ORDER — SODIUM CHLORIDE 0.9 % IV BOLUS
1000.0000 mL | Freq: Once | INTRAVENOUS | Status: AC
  Administered 2019-06-08: 1000 mL via INTRAVENOUS

## 2019-06-08 MED ORDER — SODIUM CHLORIDE 0.9 % IV SOLN
INTRAVENOUS | Status: DC | PRN
  Administered 2019-06-08: 12:00:00 via INTRAVENOUS
  Administered 2019-06-08: 08:00:00 25 ug/min via INTRAVENOUS

## 2019-06-08 MED ORDER — PANTOPRAZOLE SODIUM 40 MG IV SOLR: 40.0000 mg | Freq: Every day | INTRAVENOUS | Status: DC

## 2019-06-08 MED ORDER — ALBUMIN HUMAN 5 % IV SOLN
INTRAVENOUS | Status: DC | PRN
  Administered 2019-06-08 (×3): via INTRAVENOUS

## 2019-06-08 MED ORDER — SODIUM BICARBONATE 8.4 % IV SOLN
INTRAVENOUS | Status: DC | PRN
  Administered 2019-06-08 (×3): 50 meq via INTRAVENOUS

## 2019-06-08 MED ORDER — HYDRALAZINE HCL 20 MG/ML IJ SOLN: 5.0000 mg | INTRAMUSCULAR | Status: DC | PRN

## 2019-06-08 MED ORDER — OXYMETAZOLINE HCL 0.05 % NA SOLN
NASAL | Status: DC | PRN
  Administered 2019-06-08: 2 via NASAL

## 2019-06-08 MED ORDER — EPINEPHRINE 1 MG/10ML IJ SOSY
PREFILLED_SYRINGE | INTRAMUSCULAR | Status: DC | PRN
  Administered 2019-06-08: .1 mg via INTRAVENOUS

## 2019-06-08 MED ORDER — PROPOFOL 10 MG/ML IV BOLUS
INTRAVENOUS | Status: AC
  Filled 2019-06-08: qty 20

## 2019-06-08 MED ORDER — CHLORHEXIDINE GLUCONATE CLOTH 2 % EX PADS
6.0000 | MEDICATED_PAD | Freq: Every day | CUTANEOUS | Status: DC
  Administered 2019-06-09 – 2019-06-15 (×6): 6 via TOPICAL

## 2019-06-08 MED ORDER — SODIUM CHLORIDE 0.9 % IV SOLN
INTRAVENOUS | Status: AC
  Filled 2019-06-08: qty 1.2

## 2019-06-08 MED ORDER — ACETAMINOPHEN 325 MG RE SUPP: 325.0000 mg | RECTAL | Status: DC | PRN

## 2019-06-08 MED ORDER — FENTANYL CITRATE (PF) 250 MCG/5ML IJ SOLN
INTRAMUSCULAR | Status: AC
  Filled 2019-06-08: qty 5

## 2019-06-08 MED ORDER — MEPERIDINE HCL 25 MG/ML IJ SOLN: 6.2500 mg | INTRAMUSCULAR | Status: DC | PRN

## 2019-06-08 MED ORDER — CARBOXYMETHYLCELLULOSE SODIUM 0.5 % OP SOLN: 1.0000 [drp] | Freq: Three times a day (TID) | OPHTHALMIC | Status: DC | PRN

## 2019-06-08 MED ORDER — HYDROMORPHONE HCL 1 MG/ML IJ SOLN: 0.2500 mg | INTRAMUSCULAR | Status: DC | PRN

## 2019-06-08 MED ORDER — OXYMETAZOLINE HCL 0.05 % NA SOLN
NASAL | Status: AC
  Filled 2019-06-08: qty 30

## 2019-06-08 MED ORDER — LIDOCAINE 2% (20 MG/ML) 5 ML SYRINGE
INTRAMUSCULAR | Status: DC | PRN
  Administered 2019-06-08: 100 mg via INTRAVENOUS

## 2019-06-08 MED ORDER — PROTAMINE SULFATE 10 MG/ML IV SOLN
INTRAVENOUS | Status: AC
  Filled 2019-06-08: qty 5

## 2019-06-08 MED ORDER — POLYVINYL ALCOHOL 1.4 % OP SOLN: 1.0000 [drp] | Freq: Three times a day (TID) | OPHTHALMIC | Status: DC | PRN

## 2019-06-08 MED ORDER — GUAIFENESIN-DM 100-10 MG/5ML PO SYRP: 15.0000 mL | ORAL_SOLUTION | ORAL | Status: DC | PRN

## 2019-06-08 MED ORDER — HEMOSTATIC AGENTS (NO CHARGE) OPTIME
TOPICAL | Status: DC | PRN
  Administered 2019-06-08 (×2): 1 via TOPICAL

## 2019-06-08 MED ORDER — CEFAZOLIN SODIUM-DEXTROSE 2-4 GM/100ML-% IV SOLN
2.0000 g | Freq: Three times a day (TID) | INTRAVENOUS | Status: AC
  Administered 2019-06-08 – 2019-06-09 (×2): 2 g via INTRAVENOUS
  Filled 2019-06-08 (×3): qty 100

## 2019-06-08 MED ORDER — SODIUM CHLORIDE 0.9 % IV SOLN: INTRAVENOUS | Status: DC

## 2019-06-08 MED ORDER — SUGAMMADEX SODIUM 200 MG/2ML IV SOLN
INTRAVENOUS | Status: DC | PRN
  Administered 2019-06-08: 149 mg via INTRAVENOUS

## 2019-06-08 MED ORDER — MAGNESIUM SULFATE 2 GM/50ML IV SOLN
2.0000 g | Freq: Once | INTRAVENOUS | Status: AC | PRN
  Administered 2019-06-10: 2 g via INTRAVENOUS
  Filled 2019-06-08: qty 50

## 2019-06-08 MED ORDER — SODIUM BICARBONATE 8.4 % IV SOLN
100.0000 meq | Freq: Once | INTRAVENOUS | Status: AC
  Administered 2019-06-08: 100 meq via INTRAVENOUS

## 2019-06-08 MED ORDER — SODIUM CHLORIDE 0.9 % IV SOLN
INTRAVENOUS | Status: DC | PRN
  Administered 2019-06-08: 500 mL

## 2019-06-08 MED ORDER — LACTATED RINGERS IV SOLN
INTRAVENOUS | Status: DC | PRN
  Administered 2019-06-08 (×2): via INTRAVENOUS

## 2019-06-08 MED ORDER — FENTANYL CITRATE (PF) 250 MCG/5ML IJ SOLN
INTRAMUSCULAR | Status: DC | PRN
  Administered 2019-06-08 (×3): 50 ug via INTRAVENOUS
  Administered 2019-06-08: 100 ug via INTRAVENOUS
  Administered 2019-06-08: 400 ug via INTRAVENOUS

## 2019-06-08 MED ORDER — PANTOPRAZOLE SODIUM 40 MG PO TBEC: 40.0000 mg | DELAYED_RELEASE_TABLET | Freq: Every day | ORAL | Status: DC

## 2019-06-08 MED ORDER — ONDANSETRON HCL 4 MG/2ML IJ SOLN
INTRAMUSCULAR | Status: AC
  Filled 2019-06-08: qty 4

## 2019-06-08 MED ORDER — DEXTROSE-NACL 5-0.45 % IV SOLN
INTRAVENOUS | Status: DC
  Administered 2019-06-08 – 2019-06-09 (×3): via INTRAVENOUS
  Administered 2019-06-09: 23:00:00 125 mL/h via INTRAVENOUS
  Administered 2019-06-09 – 2019-06-10 (×4): via INTRAVENOUS
  Administered 2019-06-10: 125 mL/h via INTRAVENOUS
  Administered 2019-06-10: 21:00:00 via INTRAVENOUS
  Administered 2019-06-10: 125 mL/h via INTRAVENOUS
  Administered 2019-06-10 – 2019-06-14 (×5): via INTRAVENOUS

## 2019-06-08 MED ORDER — MORPHINE SULFATE (PF) 2 MG/ML IV SOLN
2.0000 mg | INTRAVENOUS | Status: DC | PRN
  Administered 2019-06-08 (×3): 2 mg via INTRAVENOUS
  Administered 2019-06-09: 4 mg via INTRAVENOUS
  Administered 2019-06-09: 16:00:00 2 mg via INTRAVENOUS
  Administered 2019-06-09 (×3): 4 mg via INTRAVENOUS
  Administered 2019-06-09: 2 mg via INTRAVENOUS
  Administered 2019-06-09: 4 mg via INTRAVENOUS
  Administered 2019-06-10: 2 mg via INTRAVENOUS
  Administered 2019-06-10: 4 mg via INTRAVENOUS
  Administered 2019-06-10 – 2019-06-15 (×7): 2 mg via INTRAVENOUS
  Filled 2019-06-08: qty 2
  Filled 2019-06-08 (×7): qty 1
  Filled 2019-06-08: qty 2
  Filled 2019-06-08 (×2): qty 1
  Filled 2019-06-08: qty 2
  Filled 2019-06-08: qty 1
  Filled 2019-06-08 (×3): qty 2
  Filled 2019-06-08: qty 1
  Filled 2019-06-08: qty 2

## 2019-06-08 MED ORDER — DEXAMETHASONE SODIUM PHOSPHATE 10 MG/ML IJ SOLN
INTRAMUSCULAR | Status: AC
  Filled 2019-06-08: qty 1

## 2019-06-08 MED ORDER — SODIUM CHLORIDE 0.9 % IV SOLN
INTRAVENOUS | Status: DC | PRN
  Administered 2019-06-08 (×2): via INTRAVENOUS

## 2019-06-08 MED ORDER — METOPROLOL TARTRATE 5 MG/5ML IV SOLN
2.5000 mg | Freq: Four times a day (QID) | INTRAVENOUS | Status: DC
  Administered 2019-06-08 – 2019-06-10 (×7): 2.5 mg via INTRAVENOUS
  Filled 2019-06-08 (×7): qty 5

## 2019-06-08 MED ORDER — LIDOCAINE 2% (20 MG/ML) 5 ML SYRINGE
INTRAMUSCULAR | Status: AC
  Filled 2019-06-08: qty 10

## 2019-06-08 MED ORDER — METOPROLOL TARTRATE 5 MG/5ML IV SOLN
2.0000 mg | INTRAVENOUS | Status: DC | PRN
  Administered 2019-06-10: 5 mg via INTRAVENOUS
  Filled 2019-06-08: qty 5

## 2019-06-08 MED ORDER — SODIUM CHLORIDE 0.9 % IV SOLN: INTRAVENOUS | Status: DC | PRN

## 2019-06-08 SURGICAL SUPPLY — 63 items
BLADE HEX COATED 2.75 (ELECTRODE) ×2 IMPLANT
CANISTER SUCT 3000ML PPV (MISCELLANEOUS) ×3 IMPLANT
CLIP VESOCCLUDE MED 24/CT (CLIP) ×5 IMPLANT
CLIP VESOCCLUDE SM WIDE 24/CT (CLIP) ×3 IMPLANT
COVER WAND RF STERILE (DRAPES) ×3 IMPLANT
DERMABOND ADVANCED (GAUZE/BANDAGES/DRESSINGS) ×6
DERMABOND ADVANCED .7 DNX12 (GAUZE/BANDAGES/DRESSINGS) IMPLANT
ELECT BLADE 4.0 EZ CLEAN MEGAD (MISCELLANEOUS) ×3
ELECT BLADE 6.5 EXT (BLADE) ×2 IMPLANT
ELECT REM PT RETURN 9FT ADLT (ELECTROSURGICAL) ×3
ELECTRODE BLDE 4.0 EZ CLN MEGD (MISCELLANEOUS) ×1 IMPLANT
ELECTRODE REM PT RTRN 9FT ADLT (ELECTROSURGICAL) ×1 IMPLANT
FELT TEFLON 1X6 (MISCELLANEOUS) ×3 IMPLANT
GLOVE BIO SURGEON STRL SZ 6.5 (GLOVE) ×6 IMPLANT
GLOVE BIO SURGEON STRL SZ7 (GLOVE) ×2 IMPLANT
GLOVE BIO SURGEON STRL SZ7.5 (GLOVE) ×5 IMPLANT
GLOVE BIO SURGEONS STRL SZ 6.5 (GLOVE) ×6
GLOVE BIOGEL PI IND STRL 6.5 (GLOVE) IMPLANT
GLOVE BIOGEL PI IND STRL 8 (GLOVE) ×1 IMPLANT
GLOVE BIOGEL PI INDICATOR 6.5 (GLOVE) ×14
GLOVE BIOGEL PI INDICATOR 8 (GLOVE) ×2
GLOVE SS BIOGEL STRL SZ 7.5 (GLOVE) IMPLANT
GLOVE SUPERSENSE BIOGEL SZ 7.5 (GLOVE) ×2
GLOVE SURG SS PI 6.5 STRL IVOR (GLOVE) ×6 IMPLANT
GOWN STRL NON-REIN LRG LVL3 (GOWN DISPOSABLE) ×4 IMPLANT
GOWN STRL REUS W/ TWL LRG LVL3 (GOWN DISPOSABLE) ×3 IMPLANT
GOWN STRL REUS W/ TWL XL LVL3 (GOWN DISPOSABLE) ×1 IMPLANT
GOWN STRL REUS W/TWL LRG LVL3 (GOWN DISPOSABLE) ×10
GOWN STRL REUS W/TWL XL LVL3 (GOWN DISPOSABLE) ×2
GRAFT HEMASHIELD 18X9MM (Vascular Products) ×2 IMPLANT
INSERT FOGARTY 61MM (MISCELLANEOUS) ×6 IMPLANT
INSERT FOGARTY SM (MISCELLANEOUS) ×12 IMPLANT
KIT BASIN OR (CUSTOM PROCEDURE TRAY) ×3 IMPLANT
KIT TURNOVER KIT B (KITS) ×3 IMPLANT
LOOP VESSEL MAXI BLUE (MISCELLANEOUS) ×2 IMPLANT
LOOP VESSEL MINI RED (MISCELLANEOUS) ×2 IMPLANT
NS IRRIG 1000ML POUR BTL (IV SOLUTION) ×8 IMPLANT
PACK AORTA (CUSTOM PROCEDURE TRAY) ×3 IMPLANT
PENCIL BUTTON HOLSTER BLD 10FT (ELECTRODE) ×2 IMPLANT
RETAINER VISCERA MED (MISCELLANEOUS) ×3 IMPLANT
SPONGE LAP 18X18 RF (DISPOSABLE) ×6 IMPLANT
STAPLER VISISTAT 35W (STAPLE) ×6 IMPLANT
SURGICEL SNOW 2X4 (HEMOSTASIS) ×4 IMPLANT
SUT MNCRL AB 3-0 PS2 18 (SUTURE) ×4 IMPLANT
SUT MNCRL AB 4-0 PS2 18 (SUTURE) ×6 IMPLANT
SUT PDS AB 1 TP1 96 (SUTURE) ×6 IMPLANT
SUT PROLENE 3 0 SH1 36 (SUTURE) ×11 IMPLANT
SUT PROLENE 5 0 C 1 24 (SUTURE) ×10 IMPLANT
SUT PROLENE 6 0 CC (SUTURE) ×22 IMPLANT
SUT SILK 2 0 (SUTURE) ×2
SUT SILK 2 0 SH CR/8 (SUTURE) ×3 IMPLANT
SUT SILK 2-0 18XBRD TIE 12 (SUTURE) IMPLANT
SUT SILK 3 0 (SUTURE) ×2
SUT SILK 3-0 18XBRD TIE 12 (SUTURE) IMPLANT
SUT SILK 4 0 (SUTURE) ×2
SUT SILK 4-0 18XBRD TIE 12 (SUTURE) IMPLANT
SUT VIC AB 2-0 CT1 36 (SUTURE) ×10 IMPLANT
SUT VIC AB 3-0 SH 27 (SUTURE) ×16
SUT VIC AB 3-0 SH 27X BRD (SUTURE) IMPLANT
TOWEL GREEN STERILE (TOWEL DISPOSABLE) ×3 IMPLANT
TOWEL SURG RFD BLUE STRL DISP (DISPOSABLE) ×4 IMPLANT
TRAY FOLEY MTR SLVR 16FR STAT (SET/KITS/TRAYS/PACK) ×3 IMPLANT
WATER STERILE IRR 1000ML POUR (IV SOLUTION) ×6 IMPLANT

## 2019-06-08 NOTE — Anesthesia Postprocedure Evaluation (Signed)
Anesthesia Post Note  Patient: Ryan Jackson  Procedure(s) Performed: OPEN ANEURYSM ABDOMINAL AORTIC REPAIR with Aorta Bifemoral Bypass Graft using Hemashield Gold Graft (N/A ) Extensive Lysis Of Adhesion (N/A )     Patient location during evaluation: PACU Anesthesia Type: General Level of consciousness: awake and alert Pain management: pain level controlled Vital Signs Assessment: post-procedure vital signs reviewed and stable Respiratory status: spontaneous breathing, nonlabored ventilation, respiratory function stable and patient connected to nasal cannula oxygen Cardiovascular status: blood pressure returned to baseline and stable Postop Assessment: no apparent nausea or vomiting Anesthetic complications: no    Last Vitals:  Vitals:   06/08/19 1518 06/08/19 1533  BP: 131/84 133/81  Pulse: 86 86  Resp: 15 12  Temp:    SpO2: 100% 100%    Last Pain:  Vitals:   06/08/19 1518  TempSrc:   PainSc: 0-No pain                 Aura Bibby DAVID

## 2019-06-08 NOTE — Progress Notes (Signed)
78 year old male status post open aneurysm repair today.  He has palpable femoral pulses in both groins.  There is no signs of hematoma.  He is very acidotic with a pH of 7.1.  Is also very cold and is still under a bear hugger.  Will will give him 2 units of FFP for an INR 2.1.  He will also get packed platelets for platelet count of 71.  We had AT Doppler signals in the PACU these are now hard to find but I think he needs ongoing resuscitation for his acidosis.  We will continue to monitor.  He states his feet feel normal.  He is motor and sensory intact.  Marty Heck, MD [facility] Office: [phone] Pager: [place]

## 2019-06-08 NOTE — Anesthesia Procedure Notes (Signed)
Arterial Line Insertion Start/End7/27/2020 7:03 AM, 06/08/2019 7:07 AM Performed by: Valda Favia, CRNA, CRNA  Preanesthetic checklist: patient identified, IV checked, site marked, risks and benefits discussed, surgical consent, monitors and equipment checked, pre-op evaluation, timeout performed and anesthesia consent Right, radial was placed Catheter size: 20 G Hand hygiene performed  and maximum sterile barriers used  Allen's test indicative of satisfactory collateral circulation Attempts: 1 Procedure performed without using ultrasound guided technique. Following insertion, dressing applied and Biopatch. Post procedure assessment: normal  Patient tolerated the procedure well with no immediate complications.

## 2019-06-08 NOTE — Anesthesia Procedure Notes (Signed)
Central Venous Catheter Insertion Performed by: Lillia Abed, MD, anesthesiologist Start/End7/27/2020 7:00 AM, 06/08/2019 7:15 AM Patient location: Pre-op. Preanesthetic checklist: patient identified, IV checked, risks and benefits discussed, surgical consent, monitors and equipment checked, pre-op evaluation, timeout performed and anesthesia consent Position: Trendelenburg Lidocaine 1% used for infiltration and patient sedated Hand hygiene performed  and maximum sterile barriers used  Catheter size: 8.5 Fr PA cath and Central line was placed.Sheath introducer Swan type:thermodilution Procedure performed using ultrasound guided technique. Ultrasound Notes:anatomy identified, needle tip was noted to be adjacent to the nerve/plexus identified, no ultrasound evidence of intravascular and/or intraneural injection and image(s) printed for medical record Attempts: 1 Following insertion, line sutured, dressing applied and Biopatch. Post procedure assessment: blood return through all ports, free fluid flow and no air  Patient tolerated the procedure well with no immediate complications.

## 2019-06-08 NOTE — Anesthesia Procedure Notes (Signed)
Procedure Name: Intubation Date/Time: 06/08/2019 7:53 AM Performed by: Glynda Jaeger, CRNA Pre-anesthesia Checklist: Patient identified, Patient being monitored, Timeout performed, Emergency Drugs available and Suction available Patient Re-evaluated:Patient Re-evaluated prior to induction Oxygen Delivery Method: Circle System Utilized Preoxygenation: Pre-oxygenation with 100% oxygen Induction Type: IV induction Ventilation: Mask ventilation without difficulty Laryngoscope Size: Mac and 4 Grade View: Grade I Tube type: Oral Tube size: 8.0 mm Number of attempts: 1 Airway Equipment and Method: Stylet Placement Confirmation: ETT inserted through vocal cords under direct vision,  positive ETCO2 and breath sounds checked- equal and bilateral Secured at: 23 cm Tube secured with: Tape Dental Injury: Teeth and Oropharynx as per pre-operative assessment

## 2019-06-08 NOTE — Discharge Instructions (Signed)
 [facility]  Discharge Instructions   Open Aortic Surgery  Please refer to the following instructions for your post-procedure care. Your surgeon or Physician Assistant will discuss any changes with you.  Activity  Avoid lifting more than eight pounds (a gallon of milk) until after your first post-operative visit. You are encouraged to walk as much as you can. You can slowly return to normal activities but must avoid strenuous activity and heavy lifting until your doctor tells you it's okay. Heavy lifting can hurt the incision and cause a hernia. Avoid activities such as vacuuming or swinging a golf club. It is normal to feel tired for several weeks after your surgery. Do not drive until your doctor gives the okay and you are no longer taking prescription pain medications. It is also normal to have difficulty with sleep habits, eating and bowl movements after surgery. These will go away with time.  Bathing/Showering  Shower daily after you go home. Do not soak in a bathtub, hot tub, or swim until the incision heals.  Incision Care  Shower every day. Clean your incision with mild soap and water. Pat the area dry with a clean towel. You do not need a bandage unless otherwise instructed. Do not apply any ointments or creams to your incision. You may have skin glue on your incision. Do not peel it off. It will come off on its own in about one week. If you have staples or sutures along your incision, they will be removed at your post op appointment.  If you have groin incisions, wash the groin wounds with soap and water daily and pat dry. (No tub bath-only shower)  Then put a dry gauze or washcloth in the groin to keep this area dry to help prevent wound infection.  Do this daily and as needed.  Do not use Vaseline or neosporin on your incisions.  Only use soap and water on your incisions and then protect and keep dry.  Diet  Resume your normal diet. There are no  special food restriction following this procedure. A low fat/low cholesterol diet is recommended for all patients with vascular disease. After your aortic surgery, it's normal to feel full faster than usual and to not feel as hungry as you normally would. You will probably lose weight initially following your surgery. It's best to eat small, frequent meals over the course of the day. Call the office if you find that you are unable to eat even small meals.   In order to heal from your surgery, it is CRITICAL to get adequate nutrition. Your body requires vitamins, minerals, and protein. Vegetables are the best source of vitamins and minerals. If you have pain, you may take over-the-counter pain reliever such as acetaminophen (Tylenol). If you were prescribed a stronger pain medication, please be aware these medication can cause nausea and constipation. Prevent nausea by taking the medication with a snack or meal. Avoid constipation by drinking plenty of fluids and eating foods with a high amount of fiber, such as fruits, vegetables and grains. Take 100mg of the over-the-counter stool softener Colace twice a day as needed to help with constipation. A laxative, such as Milk of Magnesia, may be recommended for you at this time. Do not take a laxative unless your surgeon or P.A. tells you it's OK.  Do not take Tylenol if you are taking stronger pain medications (such as Percocet).  Follow Up  Our office will schedule a follow up   appointment 2-3 weeks after discharge.  Please call us immediately for any of the following conditions    .     Severe or worsening pain in your legs or feet or in your abdomen back or chest. Increased pain, redness drainage (pus) from your incision site. Increased abdominal pain, bloating, nausea, vomiting, or persistent diarrhea. Fever of 101 degrees or higher. Swelling in your leg (s).  Reduce your risk of vascular disease  Stop smoking. If you would like help, call  [facility] at 1-800-QUIT-NOW (1-800-784-8669) or [place] at [phone]. Manage your cholesterol Maintain a desired weight Control your diabetes Keep your blood pressure down  If you have any questions please call the office at [phone].   

## 2019-06-08 NOTE — Op Note (Addendum)
Date: June 08, 2019  Preoperative diagnosis:  1.  Chronic infrarenal aortic dissection 2.  3 cm infrarenal aortic aneurysm 3.  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  Extensive lysis of abdominal adhesions 2.  Open abdominal aortic aneurysm repair with aorto bifemoral bypass (18 mm x 9 mm bifurcated Dacron graft) 3.  Right common femoral artery endarterectomy  Surgeon: Dr. Marty Heck, MD   Assistant: Curt Jews, MD and Leontine Locket, [place]  Indications: Patient is a 78 year old male who was recently seen in clinic and was referred from the [place] for a large left common iliac artery and hypogastric aneurysm.  He has been followed at the [facility] by Dr. Nicola Girt and on most recent surveillance imaging his left common iliac aneurysm was growing and a CT scan was obtained that confirmed a greater than 3.5 cm left common and hypogastric aneurysm.  He had significant other pathology including a chronic infrarenal aortic dissection, a small 3 similar infrarenal aneurysm, as well as a right 2.3 cm common iliac aneurysm.  In addition he has claudication symptoms with occlusive disease in his aortoiliac segments.  Discussed in detail with him and his wife that there were no endovascular options and he presents today for open aneurysm repair with planned aortobifemoral bypass after risks and benefits were discussed in detail.  Findings: A 18 mm x 9 mm bifurcated Dacron graft was sewn to the infrarenal aorta below the left renal artery end-to-end.  The chronic infra-renal dissection was fenestrated.  The IMA was ligated since there was no back bleeding.  The limbs were tunneled under each ureter and then sewn to each common femoral artery in each groin in end to side fashion.  The right common iliac aneurysm was  opened down to the bifurcation of the right external iliac and hypogastric artery where this was then oversewn just proximal to the bifurcation.  The right hypogastric artery should be perfused via retrograde perfusion of the right limb of the graft up the right external iliac artery.  I did confirm excellent doppler signal in the right hypogastric at the end of the case.  On the left the common as well as the hypogastric aneurysm were opened and several branches down the pelvis were ligated to prevent backbleeding and growth of the aneurysm so that it was completely excluded.  The left external iliac was then ligated at the takeoff.  It should be noted that the left hypogastric artery did appear occluded in the aneurysm with antegrade flow and only 1 or 2 small branches in the pelvis had some retrograde perfusion - these were ligated during the case.  The right common femoral artery required extensive endarterectomy.  There were anterior tibial artery signals at the end of the case in each leg.  Anesthesia: General  EBL: 2800 mL's  Resuscitation: 1300 mL Cell Saver, 5 L LR, 1 L normal saline, 2 units packed red blood cells, and several units of albumin  Details: The patient was taken to the operating room after informed consent was obtained.  He was placed on operative table in supine position.  His abdominal wall as well as both groins and both legs were prepped and draped in usual sterile fashion.  Preop timeout was performed after general anesthesia was induced in which the patient as well as the  procedure were identified.  Initially started out in the right groin where a vertical groin incision was made over his femoral pulse.  I dissected down with Bovie cautery through the subcutaneous tissue and identified inguinal ligament dissected down and bluntly dissected out the common femoral artery as well as SFA and profunda branches.  I dissected under the inguinal ligament and the circumflex vein was  identified and ligated between 3-0 silk ties clips and divided.  I was then able to tunnel over the right external iliac artery blunt ly with my finger.  I then performed the same procedure in the left groin again making a vertical groin incision dissecting out the common femoral as well as the profunda and SFA and Vesseloops were applied.  I did not see the circumflex vein branch on the left. Then turned my attention to the abdomen in a midline abdominal incision was made from just below the xiphoid all the way to the top of the pubis.  With Bovie cautery we carried dissection through the midline until we identified the fascia that was opened and then the peritoneal cavity was entered with blunt dissection.  From his previous several abdominal surgeries there was omentum that was stuck to the anterior abdominal wall.  Using a Metzenbaum scissors and Bovie cautery there was extensive lysis of adhesions.  Ultimately the omentum was then stuck to the small bowel and we had to lyse those adhesions in order to mobilize the small bowel as well as the transverse colon.  The small bowel was also stuck in both paracolic gutters and all of this was lysed with blunt dissection.  I also had to lyse the sigmoid colon off the small bowel in the left lower quadrant.  Once all this was freed up and the transverse colon was moved cephalad and the small bowel was eviscerated in the right upper quadrant.  I then took down retroperitoneum and mobilized the duodenum until the abdominal aneurysm could be identified.  The whole retroperitoneum was then opened down to the aortic bifurcation.  I used medium clips and met scissors and ligated any lymphatic vein branch or other vessel that identified.  Ultimately the left renal vein was mobilized and the left renal artery which was the lowest was identified and a vessel loop was placed around it.  I used blunt finger dissection as well as some Bovie cautery and the infrarenal aorta was  completely mobilized down to the aortic bifurcation.  I was careful to identify both the right and left ureter that were preserved.  Multiple lumbar branches were identified and medium vessel clips were placed to prevent backbleeding. Initially carried my dissection down the right common iliac where we identified the external and hypogastric artery that were each controlled with Vesseloops.  I then did the same on the left tunneling under the mesentery of the sigmoid colon and ultimately I had to flipped the sigmoid colon medial and then mobilized lateral to the sigmoid colon given the large left common and hypogastric aneurysm.  We opened a separate little window careful to preserve the ureter with the sigmoid colon was mobilized on the left.  Again I got a vessel loop around the left external iliac artery.  We then dug down in the pelvis and identified the hypogastric artery and there were several branches that were then encircled with a right angle and tied off with 2-0 silk ties to prevent any backbleeding into the aneurysm.  It should be noted that the antegrade  flow in the aneurysm appeared to be nonexistent in the aneurysm appeared nearly thrombosed.  At that point time I did put in umbilical tape behind the infrarenal aorta and positioned my aortic clamp in the infrarenal position.  At that point time the patient was given 100 units/kg heparin and ACT was checked to maintain greater than 250.  Should be noted that throughout the case the patient was incredibly oozy and all of his soft tissue was very oozy. At that point in time once we had a therapeutic ACT, I pulled up on my SFA and profunda clamps.  The infrarenal aorta was clamped.  I also pulled up on my vessel loop on the right hypogastric which was the only backbleeding hypogastric.  The aneurysm sac was then opened with 11 blade scalpel and extended with curved Mayo's.  Just below the clamp the aorta was transected and a limited endarterectomy was  performed here.  The aorta was full of thrombus and disease.  I then opened the aorta all the way down to the aortic bifurcation and ultimately did not have any brisk bleeding from any lumbars since these had already been ligated. A 18 mm x 9 mm bifurcated Dacron graft was brought on the field.  The main body was truncated.  I then used a 3-0 Prolene with a felt strip and a end-to-end anastomosis was sewn to the infrarenal aorta.  We did have to place several additional 3-0 sutures on the anterior wall with one pledget.  When we came off our clamp there was overall very good hemostasis at the proximal anastomosis.  When we flushed each limb the patient became profoundly hypotensive.  I suspected that he was behind on this morning resuscitation.  At that point time we reclamped the graft and anesthesia caught up given 2 units of blood he became more stable with additional volume. At that point time I then opened the left common iliac aneurysm onto the left hypogastric aneurysm.  All the thrombus was cleaned out.  There was no backbleeding from the distal hypogastric artery on the left.  The left external iliac was ligated just at its takeoff with several 2-0 silk ties and then a vessel clip was placed.  I then went down on the right side and open the common iliac artery all the way to the takeoff of the external iliac and hypogastric.  I preserved a small stump of distal right common iliac artery so that the right hypo-could get perfusion via retrograde from the right limb of the aortobifemoral graft.  I used a 3-0 Prolene and then ran a horizontal mattress suture down the back and then ligated this to prevent any backbleeding after the right common iliac artery been opened.  At that point time we then tunneled our grafts under both the right and left under the ureter and out into the right and left groins.  We already placed umbilical tape in her prepared tunnels. Dr. early started on the left groin where the SFA  profunda were clamped as well as a Henley clamp on the distal external iliac and then the artery was opened in longitudinal fashion 11 blade scalpel and Potts scissors.  An end-to-side anastomosis was then performed on the left with 5-0 Prolene. I started on the right and also pulled up on my loops on the SFA and profunda and a Henley on the distal external.  I opened up the right common femoral artery that was very heavily diseased and had a  large ulcerated plaque anterior.  Ultimately had to carry my dissection all the way down onto the SFA given a had no backbleeding initially and he had a fair amount SFA disease on the right.  I performed a endarterectomy on the right where I used a Building services engineer and then truncated the plaque in the common femoral artery and carried my dissection all the way down to the takeoff of the profunda where then truncated the plaque again to prevent a flap.  The distal flap was then tacked down with several 6-0 Prolenes.  I also tacked on the proximal flap since we needed retrograde perfuse in the right hypogastric artery.  At that point in time the right limb of the graft was then trimmed and spatulated and an end to  side anastomosis onto the right common femoral artery. When this was all complete we did not have any signals in the feet but I presume that he had dominant profunda runoff that he just needs to get warm and out of vasospasm.  The patient was given 40 mg of protamine for reversal.  Added hemostasis was obtained in the peritoneal cavity as well as in the retroperitoneum.  Around the retroperitoneum closed with a running 3-0 Vicryl to cover the graft.  There was really not enough aneurysm wall to get any coverage of the graft other than this very small segment of infrarenal aorta.  The peritoneal cavity was then washed out with saline until all the effluent was clear.  All the bowel looked healthy.  The Fascia was then run closed with #1  double-stranded PDS.  The skin was run closed with 3-0 Vicryl 4-0 Monocryl and Dermabond was applied.  I then went down and irrigated out both groins you Surgicel snow for hemostasis.  I ran a 2-0 Vicryl to reapproximate the femoral sheath in both groins and several layers of 3-0 Vicryl and 4-0 Monocryl in the skin.  Finally went down checked the feet again and he had AT signals in both feet there were monophasic.  I am hopeful that he will continue to get a better signal when he warms up and gets out of the operating room.  Complications: None  Condition: ICU  Marty Heck, MD [facility] Office: [phone] Pager: [place]

## 2019-06-08 NOTE — Progress Notes (Signed)
Patient a/ox3 but very forgetful and impulsive. Mitts placed on patient but was still able to pull out NG tube. Dr. Oneida Alar notified. Will not replace NG and will place on one to one sitter.  Low CVP, PAP numbers. Lactic acid at 9.9. Dr. Oneida Alar notified. ABG drawn. Will continue to monitor.

## 2019-06-08 NOTE — Progress Notes (Addendum)
[id number]  Patient arrived from PACU around 1630, temp was 33 degrees C, low MAPs, CVP 1-3, PAD 7s, CI 0.9. Bicarb in PACU 18.6. Distal pulses not heard with doppler in both legs. Concern for acidosis.  Standing orders for bolus 500 cc started and MD called and relayed the above information. 1L of fluid bolus ordered, 2 units of FFP ordered per MD.  PA to bedside to assess patient.  After assessment, expressed concern for acidosis. ABG revealed Bicarb 15, pH 7.1.    MD came to bedside x2.  After patient was warmed up some, patient had great distal dopplerable pulses in both ATs. Expressed results of ABG. 2 amps of HCO + 1L of fluid ordered.    Patient almost pulled NGT all the way out. NGT advanced and retaped.

## 2019-06-08 NOTE — Transfer of Care (Signed)
Immediate Anesthesia Transfer of Care Note  Patient: Ryan Jackson  Procedure(s) Performed: OPEN ANEURYSM ABDOMINAL AORTIC REPAIR with Aorta Bifemoral Bypass Graft using Hemashield Gold Graft (N/A ) Extensive Lysis Of Adhesion (N/A )  Patient Location: PACU  Anesthesia Type:General  Level of Consciousness: awake, patient cooperative and responds to stimulation  Airway & Oxygen Therapy: Patient Spontanous Breathing and Patient connected to nasal cannula oxygen  Post-op Assessment: Report given to RN, Post -op Vital signs reviewed and stable and Patient moving all extremities X 4  Post vital signs: Reviewed and stable  Last Vitals:  Vitals Value Taken Time  BP 135/107 06/08/19 1445  Temp    Pulse 87 06/08/19 1448  Resp 15 06/08/19 1448  SpO2 100 % 06/08/19 1448  Vitals shown include unvalidated device data.  Last Pain:  Vitals:   06/08/19 0628  TempSrc:   PainSc: 0-No pain         Complications: No apparent anesthesia complications

## 2019-06-08 NOTE — H&P (Signed)
History and Physical Interval Note:  06/08/2019 7:08 AM  Ryan Jackson  has presented today for surgery, with the diagnosis of abdominal aortic aneurysm.  The various methods of treatment have been discussed with the patient and family. After consideration of risks, benefits and other options for treatment, the patient has consented to  Procedure(s): ANEURYSM ABDOMINAL AORTIC REPAIR (N/A) as a surgical intervention.  The patient's history has been reviewed, patient examined, no change in status, stable for surgery.  I have reviewed the patient's chart and labs.  Questions were answered to the patient's satisfaction.    Open AAA repair - likely aortobifemoral bypass given chronic infra-renal aortic dissection with small aneurysm, left common iliac aneurysm and hypo aneurysm > 3.5 cm, and right common iliac aneurysm but also occlusive disease and claudication symptoms.  Marty Heck   REASON FOR CONSULT: Large left common iliac artery aneurysm (3.4 cm)  HPI: Ryan Jackson is a 78 y.o. 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  He presents today to further discuss surgery with his wife after echocardiogram.  As previously noted, patient has known about several aneurysms in his abdomen for years and has been followed at the [place].  He has been seen by Dr. Nicola Girt a vascular surgeon there with serial duplex imaging.   In review of records, in 2019 on duplex had ectatic infra-renal aorta at 2.7 cm, right common iliac was 2.3 cm, and left was 1.8 cm.  There was growth on duplex this year and CTA was ordered.  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  In addition he had a 3.4 cm distal left common iliac aneurysm with  involvement of the proximal hypogastric on that side.  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  In addition he endorses bilateral lower extremity claudication symptoms.  He states he no longer smokes.  States he never had a heart attack.  States he is able to mow his grass with a riding mower and can walk from the office to the parking lot without stopped.  States both of his legs hurt with claudication symptoms and he can walk about 100 feet before he has to stop.  ABI's at [facility] 0.53 right, 0.44 left.  He does take Plavix.   No abdominal or back pain on todays visit.  Aneurysm is otherwise asymptomatic.    Previous abdominal surgery includes bilateral oblique abdominal incisions and he states one of these was for suspected appendicitis and then they had to make an incision on the other side when learned he had perforated bowel.      Past Medical History:  Diagnosis Date  . Hypertension   . Iliac artery aneurysm [facility])          Past Surgical History:  Procedure Laterality Date  . SMALL INTESTINE SURGERY      Family History  Family history unknown: Yes    SOCIAL HISTORY: Social History        Socioeconomic History  . Marital status: Unknown    Spouse name: Not on file  . Number of children: Not on file  . Years of education: Not on file  . Highest education level: Not on file  Occupational History  .  Not on file  Social Needs  . Financial resource strain: Not on file  . Food insecurity    Worry: Not on file    Inability: Not on file  . Transportation needs    Medical: Not on file    Non-medical: Not on file  Tobacco Use  . Smoking status: Former Smoker    Types: Cigarettes    Quit date: 2010    Years since quitting: 10.4  . Smokeless tobacco: Never Used  Substance and Sexual Activity  . Alcohol use: Not Currently  . Drug use: Never  . Sexual activity: Not on file  Lifestyle  . Physical activity    Days per week: Not on  file    Minutes per session: Not on file  . Stress: Not on file  Relationships  . Social Herbalist on phone: Not on file    Gets together: Not on file    Attends religious service: Not on file    Active member of club or organization: Not on file    Attends meetings of clubs or organizations: Not on file    Relationship status: Not on file  . Intimate partner violence    Fear of current or ex partner: Not on file    Emotionally abused: Not on file    Physically abused: Not on file    Forced sexual activity: Not on file  Other Topics Concern  . Not on file  Social History Narrative  . Not on file        Allergies  Allergen Reactions  . Mevacor [Lovastatin]   . Zocor [Simvastatin]           Current Outpatient Medications  Medication Sig Dispense Refill  . clopidogrel (PLAVIX) 75 MG tablet Take 75 mg by mouth daily.    . hydrocortisone 2.5 % cream Apply topically daily.    Marland Kitchen lisinopril (ZESTRIL) 20 MG tablet Take 20 mg by mouth daily.    Marland Kitchen omeprazole (PRILOSEC) 40 MG capsule Take 40 mg by mouth daily.     No current facility-administered medications for this visit.     REVIEW OF SYSTEMS:  [X]  denotes positive finding, [ ]  denotes negative finding Cardiac  Comments:  Chest pain or chest pressure:    Shortness of breath upon exertion:    Short of breath when lying flat:    Irregular heart rhythm:        Vascular    Pain in calf, thigh, or hip brought on by ambulation: x bilateral  Pain in feet at night that wakes you up from your sleep:     Blood clot in your veins:    Leg swelling:         Pulmonary    Oxygen at home:    Productive cough:     Wheezing:         Neurologic    Sudden weakness in arms or legs:     Sudden numbness in arms or legs:     Sudden onset of difficulty speaking or slurred speech:    Temporary loss of vision in one eye:     Problems with dizziness:          Gastrointestinal    Blood in stool:     Vomited blood:         Genitourinary    Burning when urinating:     Blood in urine:        Psychiatric  Major depression:         Hematologic    Bleeding problems:    Problems with blood clotting too easily:        Skin    Rashes or ulcers:        Constitutional    Fever or chills:      PHYSICAL EXAM:    Vitals:   04/28/19 0913  BP: (!) 173/69  Pulse: (!) 51  Resp: 18  Temp: 97.6 F (36.4 C)  TempSrc: Temporal  SpO2: 100%  Weight: 167 lb (75.8 kg)  Height: 5\' 7"  (1.702 m)    GENERAL: The patient is a well-nourished male, in no acute distress. The vital signs are documented above. CARDIAC: There is a regular rate and rhythm.  VASCULAR:  1+ bilateral radial pulses palpable 1+ bilateral femoral pulses palpable No tissue loss lower extremity DP signals bilaterally PULMONARY: There is good air exchange bilaterally without wheezing or rales. ABDOMEN: Soft and non-tender with normal pitched bowel sounds.  Bilateral oblique abdominal incisions. MUSCULOSKELETAL: There are no major deformities or cyanosis. NEUROLOGIC: No focal weakness or paresthesias are detected. SKIN: There are no ulcers or rashes noted. PSYCHIATRIC: The patient has a normal affect.  DATA:   I independently reviewed his CTA from the [place] that was sent by disc.  He appears to have chronic abdominal aortic dissection starting at the level of the left renal artery which is the lowest.  He has stenosis of bilateral renal arteries.  He has a small infrarenal abdominal aneurysm approximately 3 cm.  His left distal common iliac aneurysm measures about 3.4 cm and he has significant mural thrombus within the aneurysm (nearly full of thrombus) with a stenosis of his common iliac.  The aneurysm does extend into the proximal hypogastric on the left although the hypo-does appear occluded proximally (but there is  a branch distally with some retrograde perfusion).  On the right he has a small 2.3 cm common iliac artery aneurysm.  Bilateral common femoral arteries are patent.  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  Assessment/Plan:  78 year old male with history of hypertension and hyperlipidemia that presents as referral from the [place] primarily for evaluation of a 3.4 cm left common iliac artery aneurysm.  In addition, he has a chronic infrarenal aortic dissection and a 3 cm infrarenal abdominal aortic aneurysm he also has a small 2.3 cm right common iliac aneurysm in addition to the larger left common iliac that is 3.4 cm.  Discussed with him and his wife in further detail today that he has no endovascular options and requires open surgical repair.  My plan would be open aneurysm repair with a bifurcated graft likely to the left common femoral artery and right bifurcation of the external iliac and hypogastric.   Echocardiogram showed normal EF of 60-65% and no wall motion abnormalities.  He denies any previous cardiac events.  He states no chest pain or shortness of breath.  A long discussion with him and his wife about the involved nature of this operation.  Discussed typically at least a 7-day hospital stay under normal circumstances including ICU stay.  I quoted him a greater than 30% risk of major complication including cardiac event, bleeding requiring return to the OR, renal failure ultimately needing dialysis etc.  He wants to proceed and states he has a clear understanding of the risks and benefits.  His wife wants to be present with him in the hospital during his recovery.  Discussed the current situation with COVID and [facility] not allowing visitors.  As a result we will look into July for a date when potentially the restrictions will allow her to visit.  Discussed the need to hold his Plavix 7 days before surgery and can continue baby aspirin.   Marty Heck,  MD [facility] Office: [phone] Pager: [phone]

## 2019-06-09 ENCOUNTER — Inpatient Hospital Stay (HOSPITAL_COMMUNITY): Payer: No Typology Code available for payment source

## 2019-06-09 ENCOUNTER — Encounter (HOSPITAL_COMMUNITY): Payer: Self-pay | Admitting: Vascular Surgery

## 2019-06-09 LAB — POCT I-STAT 7, (LYTES, BLD GAS, ICA,H+H)
Acid-base deficit: 3 mmol/L — ABNORMAL HIGH (ref 0.0–2.0)
Acid-base deficit: 5 mmol/L — ABNORMAL HIGH (ref 0.0–2.0)
Acid-base deficit: 8 mmol/L — ABNORMAL HIGH (ref 0.0–2.0)
Acid-base deficit: 11 mmol/L — ABNORMAL HIGH (ref 0.0–2.0)
Acid-base deficit: 4 mmol/L — ABNORMAL HIGH (ref 0.0–2.0)
Bicarbonate: 24.6 mmol/L (ref 20.0–28.0)
Bicarbonate: 22.4 mmol/L (ref 20.0–28.0)
Bicarbonate: 20.2 mmol/L (ref 20.0–28.0)
Bicarbonate: 19.5 mmol/L — ABNORMAL LOW (ref 20.0–28.0)
Bicarbonate: 17.2 mmol/L — ABNORMAL LOW (ref 20.0–28.0)
Bicarbonate: 21.6 mmol/L (ref 20.0–28.0)
Calcium, Ion: 1.2 mmol/L (ref 1.15–1.40)
Calcium, Ion: 1.13 mmol/L — ABNORMAL LOW (ref 1.15–1.40)
Calcium, Ion: 1.08 mmol/L — ABNORMAL LOW (ref 1.15–1.40)
Calcium, Ion: 0.99 mmol/L — ABNORMAL LOW (ref 1.15–1.40)
Calcium, Ion: 1.03 mmol/L — ABNORMAL LOW (ref 1.15–1.40)
Calcium, Ion: 1.07 mmol/L — ABNORMAL LOW (ref 1.15–1.40)
HCT: 36 % — ABNORMAL LOW (ref 39.0–52.0)
HCT: 32 % — ABNORMAL LOW (ref 39.0–52.0)
HCT: 22 % — ABNORMAL LOW (ref 39.0–52.0)
HCT: 32 % — ABNORMAL LOW (ref 39.0–52.0)
HCT: 33 % — ABNORMAL LOW (ref 39.0–52.0)
HCT: 27 % — ABNORMAL LOW (ref 39.0–52.0)
Hemoglobin: 12.2 g/dL — ABNORMAL LOW (ref 13.0–17.0)
Hemoglobin: 10.9 g/dL — ABNORMAL LOW (ref 13.0–17.0)
Hemoglobin: 7.5 g/dL — ABNORMAL LOW (ref 13.0–17.0)
Hemoglobin: 10.9 g/dL — ABNORMAL LOW (ref 13.0–17.0)
Hemoglobin: 11.2 g/dL — ABNORMAL LOW (ref 13.0–17.0)
Hemoglobin: 9.2 g/dL — ABNORMAL LOW (ref 13.0–17.0)
O2 Saturation: 100 %
O2 Saturation: 100 %
O2 Saturation: 100 %
O2 Saturation: 100 %
O2 Saturation: 100 %
O2 Saturation: 95 %
Patient temperature: 36.7
Potassium: 3.8 mmol/L (ref 3.5–5.1)
Potassium: 3.9 mmol/L (ref 3.5–5.1)
Potassium: 3.6 mmol/L (ref 3.5–5.1)
Potassium: 3.8 mmol/L (ref 3.5–5.1)
Potassium: 4.2 mmol/L (ref 3.5–5.1)
Potassium: 3.8 mmol/L (ref 3.5–5.1)
Sodium: 139 mmol/L (ref 135–145)
Sodium: 138 mmol/L (ref 135–145)
Sodium: 138 mmol/L (ref 135–145)
Sodium: 141 mmol/L (ref 135–145)
Sodium: 141 mmol/L (ref 135–145)
Sodium: 142 mmol/L (ref 135–145)
TCO2: 26 mmol/L (ref 22–32)
TCO2: 24 mmol/L (ref 22–32)
TCO2: 21 mmol/L — ABNORMAL LOW (ref 22–32)
TCO2: 21 mmol/L — ABNORMAL LOW (ref 22–32)
TCO2: 19 mmol/L — ABNORMAL LOW (ref 22–32)
TCO2: 23 mmol/L (ref 22–32)
pCO2 arterial: 41.1 mmHg (ref 32.0–48.0)
pCO2 arterial: 40.5 mmHg (ref 32.0–48.0)
pCO2 arterial: 37.3 mmHg (ref 32.0–48.0)
pCO2 arterial: 44.9 mmHg (ref 32.0–48.0)
pCO2 arterial: 46.9 mmHg (ref 32.0–48.0)
pCO2 arterial: 39.6 mmHg (ref 32.0–48.0)
pH, Arterial: 7.385 (ref 7.350–7.450)
pH, Arterial: 7.351 (ref 7.350–7.450)
pH, Arterial: 7.341 — ABNORMAL LOW (ref 7.350–7.450)
pH, Arterial: 7.246 — ABNORMAL LOW (ref 7.350–7.450)
pH, Arterial: 7.173 — CL (ref 7.350–7.450)
pH, Arterial: 7.342 — ABNORMAL LOW (ref 7.350–7.450)
pO2, Arterial: 190 mmHg — ABNORMAL HIGH (ref 83.0–108.0)
pO2, Arterial: 244 mmHg — ABNORMAL HIGH (ref 83.0–108.0)
pO2, Arterial: 316 mmHg — ABNORMAL HIGH (ref 83.0–108.0)
pO2, Arterial: 234 mmHg — ABNORMAL HIGH (ref 83.0–108.0)
pO2, Arterial: 227 mmHg — ABNORMAL HIGH (ref 83.0–108.0)
pO2, Arterial: 79 mmHg — ABNORMAL LOW (ref 83.0–108.0)

## 2019-06-09 LAB — BPAM PLATELET PHERESIS
Blood Product Expiration Date: 202007282359
ISSUE DATE / TIME: 202007272013
Unit Type and Rh: 6200

## 2019-06-09 LAB — COMPREHENSIVE METABOLIC PANEL
ALT: 15 U/L (ref 0–44)
ALT: 18 U/L (ref 0–44)
AST: 53 U/L — ABNORMAL HIGH (ref 15–41)
AST: 63 U/L — ABNORMAL HIGH (ref 15–41)
Albumin: 2.5 g/dL — ABNORMAL LOW (ref 3.5–5.0)
Albumin: 2.5 g/dL — ABNORMAL LOW (ref 3.5–5.0)
Alkaline Phosphatase: 40 U/L (ref 38–126)
Alkaline Phosphatase: 43 U/L (ref 38–126)
Anion gap: 15 (ref 5–15)
Anion gap: 11 (ref 5–15)
BUN: 16 mg/dL (ref 8–23)
BUN: 18 mg/dL (ref 8–23)
CO2: 17 mmol/L — ABNORMAL LOW (ref 22–32)
CO2: 21 mmol/L — ABNORMAL LOW (ref 22–32)
Calcium: 6.9 mg/dL — ABNORMAL LOW (ref 8.9–10.3)
Calcium: 6.9 mg/dL — ABNORMAL LOW (ref 8.9–10.3)
Chloride: 111 mmol/L (ref 98–111)
Chloride: 109 mmol/L (ref 98–111)
Creatinine, Ser: 1.54 mg/dL — ABNORMAL HIGH (ref 0.61–1.24)
Creatinine, Ser: 1.55 mg/dL — ABNORMAL HIGH (ref 0.61–1.24)
GFR calc Af Amer: 49 mL/min — ABNORMAL LOW (ref >60)
GFR calc Af Amer: 49 mL/min — ABNORMAL LOW (ref >60)
GFR calc non Af Amer: 43 mL/min — ABNORMAL LOW (ref >60)
GFR calc non Af Amer: 42 mL/min — ABNORMAL LOW (ref >60)
Glucose, Bld: 244 mg/dL — ABNORMAL HIGH (ref 70–99)
Glucose, Bld: 202 mg/dL — ABNORMAL HIGH (ref 70–99)
Potassium: 3.6 mmol/L (ref 3.5–5.1)
Potassium: 3.9 mmol/L (ref 3.5–5.1)
Sodium: 143 mmol/L (ref 135–145)
Sodium: 141 mmol/L (ref 135–145)
Total Bilirubin: 0.6 mg/dL (ref 0.3–1.2)
Total Bilirubin: 0.5 mg/dL (ref 0.3–1.2)
Total Protein: 4.1 g/dL — ABNORMAL LOW (ref 6.5–8.1)
Total Protein: 4 g/dL — ABNORMAL LOW (ref 6.5–8.1)

## 2019-06-09 LAB — CBC
HCT: 30.4 % — ABNORMAL LOW (ref 39.0–52.0)
HCT: 28.6 % — ABNORMAL LOW (ref 39.0–52.0)
HCT: 29.6 % — ABNORMAL LOW (ref 39.0–52.0)
Hemoglobin: 9.8 g/dL — ABNORMAL LOW (ref 13.0–17.0)
Hemoglobin: 9.3 g/dL — ABNORMAL LOW (ref 13.0–17.0)
Hemoglobin: 9.5 g/dL — ABNORMAL LOW (ref 13.0–17.0)
MCH: 28.4 pg (ref 26.0–34.0)
MCH: 28.4 pg (ref 26.0–34.0)
MCH: 28.3 pg (ref 26.0–34.0)
MCHC: 32.2 g/dL (ref 30.0–36.0)
MCHC: 32.5 g/dL (ref 30.0–36.0)
MCHC: 32.1 g/dL (ref 30.0–36.0)
MCV: 88.1 fL (ref 80.0–100.0)
MCV: 87.2 fL (ref 80.0–100.0)
MCV: 88.1 fL (ref 80.0–100.0)
Platelets: 93 10*3/uL — ABNORMAL LOW (ref 150–400)
Platelets: 80 10*3/uL — ABNORMAL LOW (ref 150–400)
Platelets: 80 10*3/uL — ABNORMAL LOW (ref 150–400)
RBC: 3.45 MIL/uL — ABNORMAL LOW (ref 4.22–5.81)
RBC: 3.28 MIL/uL — ABNORMAL LOW (ref 4.22–5.81)
RBC: 3.36 MIL/uL — ABNORMAL LOW (ref 4.22–5.81)
RDW: 19.9 % — ABNORMAL HIGH (ref 11.5–15.5)
RDW: 19.7 % — ABNORMAL HIGH (ref 11.5–15.5)
RDW: 20.2 % — ABNORMAL HIGH (ref 11.5–15.5)
WBC: 10.4 10*3/uL (ref 4.0–10.5)
WBC: 12.4 10*3/uL — ABNORMAL HIGH (ref 4.0–10.5)
WBC: 15.1 10*3/uL — ABNORMAL HIGH (ref 4.0–10.5)
nRBC: 0 % (ref 0.0–0.2)
nRBC: 0 % (ref 0.0–0.2)
nRBC: 0 % (ref 0.0–0.2)

## 2019-06-09 LAB — BPAM RBC
Blood Product Expiration Date: 202008242359
Blood Product Expiration Date: 202008242359
ISSUE DATE / TIME: 202007271108
ISSUE DATE / TIME: 202007271108
Unit Type and Rh: 5100
Unit Type and Rh: 5100

## 2019-06-09 LAB — AMYLASE: Amylase: 64 U/L (ref 28–100)

## 2019-06-09 LAB — TYPE AND SCREEN
ABO/RH(D): O POS
Antibody Screen: NEGATIVE
Unit division: 0
Unit division: 0

## 2019-06-09 LAB — LACTIC ACID, PLASMA
Lactic Acid, Venous: 8.6 mmol/L (ref 0.5–1.9)
Lactic Acid, Venous: 4.5 mmol/L (ref 0.5–1.9)
Lactic Acid, Venous: 3.8 mmol/L (ref 0.5–1.9)

## 2019-06-09 LAB — POCT ACTIVATED CLOTTING TIME
Activated Clotting Time: 252 seconds
Activated Clotting Time: 252 seconds
Activated Clotting Time: 142 seconds

## 2019-06-09 LAB — PREPARE PLATELET PHERESIS: Unit division: 0

## 2019-06-09 LAB — BPAM FFP
Blood Product Expiration Date: 202007302359
Blood Product Expiration Date: 202007302359
ISSUE DATE / TIME: 202007271646
ISSUE DATE / TIME: 202007271646
Unit Type and Rh: 600
Unit Type and Rh: 6200

## 2019-06-09 LAB — PREPARE FRESH FROZEN PLASMA
Unit division: 0
Unit division: 0

## 2019-06-09 LAB — MAGNESIUM: Magnesium: 1.5 mg/dL — ABNORMAL LOW (ref 1.7–2.4)

## 2019-06-09 NOTE — Addendum Note (Signed)
Addendum  created 06/09/19 1303 by Glynda Jaeger, CRNA   Charge Capture section accepted

## 2019-06-09 NOTE — Progress Notes (Signed)
Physical Therapy Evaluation Patient Details Name: Khoi Hamberger MRN: [id number] DOB: [birth year] Today's Date: 06/09/2019   History of Present Illness  Ryan Jackson is a 78 y.o. 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  He presents today to further discuss surgery with his wife after echocardiogram.  7/27 s/p lysis of abdominal adhesions, AAA repair with aortobifemoral bypass, R common femoral art endarterectomy.  Clinical Impression  Pt admitted with/for repair of AAA with aortobifemoral BPG and femoral art endarterectomy.  Pt is presently encephalopathic and unable to follow commands well or focus in therapy therapeutically.  This evaluation was limited and another mobility assessment is warranted..  Pt currently limited functionally due to the problems listed. ( See problems list.)   Pt will benefit from PT to maximize function and safety in order to get ready for next venue listed below.     Follow Up Recommendations SNF;Supervision/Assistance - 24 hour(but based on progress made, given he is usually Independent)    Equipment Recommendations  None recommended by PT;Other (comment)(TBA)    Recommendations for Other Services       Precautions / Restrictions Precautions Precautions: Fall Restrictions Weight Bearing Restrictions: No      Mobility  Bed Mobility               General bed mobility comments: OOB in chair upon arrival  Transfers Overall transfer level: Needs assistance               General transfer comment: attempted sit to stand from chair for repositioning but patient declined; +2 max assist to reposition in chair, as pt sliding foward   Ambulation/Gait             General Gait Details: per nursing staff, pt was able to ambulate with 2 nurses at an earlier time  prior to therapy evaluations, but pt was resistant to mobility at time o the evaluation.  Stairs            Wheelchair Mobility    Modified Rankin (Stroke Patients Only)       Balance Overall balance assessment: Needs assistance Sitting-balance support: No upper extremity supported;Feet supported Sitting balance-Leahy Scale: Fair                                       Pertinent Vitals/Pain Pain Assessment: Faces Faces Pain Scale: Hurts little more Pain Location: incisional (assumed) Pain Descriptors / Indicators: Operative site guarding;Guarding;Grimacing;Discomfort Pain Intervention(s): Limited activity within patient's tolerance    Home Living Family/patient expects to be discharged to:: Private residence Living Arrangements: Spouse/significant other Available Help at Discharge: Family Type of Home: House Home Access: Stairs to enter   Technical brewer of Steps: 2 Home Layout: One level        Prior Function Level of Independence: Independent         Comments: driving      Hand Dominance        Extremity/Trunk Assessment   Upper Extremity Assessment Upper Extremity Assessment: Generalized weakness    Lower Extremity Assessment Lower Extremity Assessment: Generalized weakness       Communication      Cognition Arousal/Alertness: Lethargic Behavior During Therapy: Flat affect;Agitated Overall Cognitive Status: No family/caregiver present to  determine baseline cognitive functioning                                 General Comments: pt oriented to name (not birthday, place, situation or time), inconsistently following commands, but noted pt with limited participation during sesson      General Comments General comments (skin integrity, edema, etc.): very limited eval, pt continuously reporting "I'm fine, don't worry about me"     Exercises     Assessment/Plan    PT Assessment Patient needs continued PT  services  PT Problem List Decreased activity tolerance;Decreased mobility;Decreased safety awareness;Decreased strength       PT Treatment Interventions Gait training;DME instruction;Functional mobility training;Therapeutic activities;Patient/family education    PT Goals (Current goals can be found in the Care Plan section)  Acute Rehab PT Goals Patient Stated Goal: none stated PT Goal Formulation: Patient unable to participate in goal setting Time For Goal Achievement: 06/23/19 Potential to Achieve Goals: Fair    Frequency Min 3X/week   Barriers to discharge        Co-evaluation PT/OT/SLP Co-Evaluation/Treatment: Yes Reason for Co-Treatment: Necessary to address cognition/behavior during functional activity PT goals addressed during session: Mobility/safety with mobility OT goals addressed during session: ADL's and self-care       AM-PAC PT "6 Clicks" Mobility  Outcome Measure Help needed turning from your back to your side while in a flat bed without using bedrails?: A Little Help needed moving from lying on your back to sitting on the side of a flat bed without using bedrails?: A Little Help needed moving to and from a bed to a chair (including a wheelchair)?: A Little Help needed standing up from a chair using your arms (e.g., wheelchair or bedside chair)?: A Lot Help needed to walk in hospital room?: A Little Help needed climbing 3-5 steps with a railing? : A Lot 6 Click Score: 16    End of Session   Activity Tolerance: Other (comment)(pt was resistant to any encouragement to mobilize) Patient left: in chair;with call bell/phone within reach;Other (comment)(repositioned) Nurse Communication: Mobility status PT Visit Diagnosis: Difficulty in walking, not elsewhere classified (R26.2);Other symptoms and signs involving the nervous system (R29.898);Other abnormalities of gait and mobility (R26.89)    Time: 1017-5102 PT Time Calculation (min) (ACUTE ONLY): 16  min   Charges:   PT Evaluation $PT Eval Moderate Complexity: 1 Mod          06/09/2019  Donnella Sham, PT Acute Rehabilitation Services [phone]  (pager) [phone]  (office)  Tessie Fass Rekia Kujala 06/09/2019, 6:21 PM

## 2019-06-09 NOTE — Evaluation (Addendum)
Occupational Therapy Evaluation Patient Details Name: Ryan Jackson MRN: [id number] DOB: Apr 01, 1941 Today's Date: 06/09/2019    History of Present Illness Ryan Jackson is a 78 y.o. 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  He presents today to further discuss surgery with his wife after echocardiogram.  7/27 s/p lysis of abdominal adhesions, AAA repair with aortobifemoral bypass, R common femoral art endarterectomy.   Clinical Impression   Patient admitted for above and limited by problem list below, including impaired cognition, decreased activity tolerance, impaired balance.  Patient oriented to his name only, inconsistently following commands; noted limited participation with pt declining to engage with therapists for mobility/ADLs.  Today, requires max assist +2 to reposition in recliner (as pt sliding out of chair).  Reviewed safety and role of therapy, pt perseverating on "I'm good, don't worry about me". Spoke to spouse, [name], who reports at baseline patient is independent and driving (although becomes confused at times).  Based on performance today, patient will benefit from further OT services while admitted and after dc at SNF rehab to maximize independence and safety with ADLs/mobility. Will continue to follow and update dc recommendations as needed, pending progress.     Follow Up Recommendations  SNF;Supervision/Assistance - 24 hour(pending progress/participation)    Equipment Recommendations  3 in 1 bedside commode    Recommendations for Other Services Speech consult     Precautions / Restrictions Precautions Precautions: Fall Restrictions Weight Bearing Restrictions: No      Mobility Bed Mobility               General bed mobility comments: OOB in chair upon  arrival  Transfers Overall transfer level: Needs assistance               General transfer comment: attempted sit to stand from chair for repositioning but patient declined; +2 max assist to reposition in chair, as pt sliding foward     Balance Overall balance assessment: Needs assistance Sitting-balance support: No upper extremity supported;Feet supported Sitting balance-Leahy Scale: Fair                                     ADL either performed or assessed with clinical judgement   ADL Overall ADL's : Needs assistance/impaired                                       General ADL Comments: pt requires total assist for ADLs at this time, pt unable to follow commands/refusing to participate during session      Vision   Additional Comments: further assessment required     Perception     Praxis      Pertinent Vitals/Pain Pain Assessment: Faces Faces Pain Scale: Hurts little more Pain Location: incisional (assumed) Pain Descriptors / Indicators: Operative site guarding;Guarding;Grimacing;Discomfort Pain Intervention(s): Monitored during session;Repositioned     Hand Dominance     Extremity/Trunk Assessment Upper Extremity Assessment Upper Extremity Assessment: Generalized weakness;Difficult to assess due to impaired cognition   Lower Extremity Assessment Lower Extremity Assessment: Defer to PT evaluation       Communication     Cognition Arousal/Alertness: Lethargic Behavior During Therapy: Flat  affect;Agitated Overall Cognitive Status: No family/caregiver present to determine baseline cognitive functioning                                 General Comments: pt oriented to name (not birthday, place, situation or time), inconsistently following commands, but noted pt with limited participation during sesson   General Comments  very limited eval, pt continuously reporting "I'm fine, don't worry about me"      Exercises     Shoulder Instructions      Home Living Family/patient expects to be discharged to:: Private residence Living Arrangements: Spouse/significant other Available Help at Discharge: Family Type of Home: House Home Access: Stairs to enter Technical brewer of Steps: 2   Home Layout: One level     Bathroom Shower/Tub: Walk-in shower                    Prior Functioning/Environment Level of Independence: Independent        Comments: driving         OT Problem List: Decreased strength;Impaired balance (sitting and/or standing);Decreased activity tolerance;Decreased cognition;Decreased safety awareness;Decreased coordination;Decreased range of motion;Decreased knowledge of use of DME or AE;Decreased knowledge of precautions;Cardiopulmonary status limiting activity;Pain;Obesity      OT Treatment/Interventions: Self-care/ADL training;DME and/or AE instruction;Therapeutic exercise;Therapeutic activities;Patient/family education;Balance training;Cognitive remediation/compensation    OT Goals(Current goals can be found in the care plan section) Acute Rehab OT Goals Patient Stated Goal: none stated OT Goal Formulation: Patient unable to participate in goal setting Time For Goal Achievement: 06/23/19 Potential to Achieve Goals: Fair  OT Frequency: Min 2X/week   Barriers to D/C:            Co-evaluation PT/OT/SLP Co-Evaluation/Treatment: Yes Reason for Co-Treatment: Necessary to address cognition/behavior during functional activity;For patient/therapist safety;To address functional/ADL transfers   OT goals addressed during session: ADL's and self-care      AM-PAC OT "6 Clicks" Daily Activity     Outcome Measure Help from another person eating meals?: Total Help from another person taking care of personal grooming?: Total Help from another person toileting, which includes using toliet, bedpan, or urinal?: Total Help from another person bathing  (including washing, rinsing, drying)?: Total Help from another person to put on and taking off regular upper body clothing?: Total Help from another person to put on and taking off regular lower body clothing?: Total 6 Click Score: 6   End of Session Nurse Communication: Mobility status  Activity Tolerance: Treatment limited secondary to agitation Patient left: in chair;with call bell/phone within reach;with nursing/sitter in room  OT Visit Diagnosis: Other abnormalities of gait and mobility (R26.89);Muscle weakness (generalized) (M62.81);Pain;Other symptoms and signs involving cognitive function Pain - part of body: (incisonal)                Time: 7829-5621 OT Time Calculation (min): 16 min Charges:  OT General Charges $OT Visit: 1 Visit OT Evaluation $OT Eval Moderate Complexity: 1 [place], OT Acute Rehabilitation Services Pager [phone] Office [phone]   Delight Stare 06/09/2019, 1:40 PM

## 2019-06-09 NOTE — Progress Notes (Signed)
[facility]  Subjective  - A little confused this morning.  Off all drips.  Brisk AT signals bilaterally.   Objective (!) 149/61 86 97.9 F (36.6 C) 14 100%  Intake/Output Summary (Last 24 hours) at 06/09/2019 0759 Last data filed at 06/09/2019 0600 Gross per 24 hour  Intake 14338.55 ml  Output 4005 ml  Net 10333.55 ml    Mottling on abdominal wall and both thighs improved Brisk AT signals bilaterally Feet motor and sensory intact Midline and groin incisions c/d/i  Laboratory Lab Results: Recent Labs    06/08/19 2049 06/08/19 2245 06/08/19 2358  WBC 10.5  --  10.4  HGB 8.8* 9.2* 9.8*  HCT 27.7* 27.0* 30.4*  PLT 99*  --  93*   BMET Recent Labs    06/08/19 1455  06/08/19 2245 06/08/19 2358  NA 141   < > 145 143  K 4.4   < > 3.5 3.6  CL 110  --   --  111  CO2 17*  --   --  17*  GLUCOSE 209*  --   --  244*  BUN 15  --   --  16  CREATININE 1.24  --   --  1.54*  CALCIUM 6.8*  --   --  6.9*   < > = values in this interval not displayed.    COAG Lab Results  Component Value Date   INR 1.5 (H) 06/08/2019   INR 2.1 (H) 06/08/2019   INR 1.0 05/27/2019   No results found for: PTT  Assessment/Planning: POD #1 s/p open AAA repair with aorto-bifemoral bypass.  N: GCS 14, a little confused this am, no other neuro deficits, moving legs well CV: No drips, Hgb 8.8 --> 9.8, no blood products overnight, got 2 units FFP and 1 unit platelets in ICU yesterday evening - INR 1.5 now from 2.1, platelets 93 on last check GI: NPO, pulled NG, will hold on replacing at this time, abdomen soft Renal: High risk for ATN given profound hypotension in OR, UOP 20 - 75 mL/hr, will monitor, IVF 125 mL today, last Cr bumped to 1.54, recheck this am, keep foley ID: Ancef for post-op prophylaxis FEN: IVF 125, lytes ok, monitoring acidosis, repeat gas and lactic acid this am Dispo:Keep in ICU today, will d/c swan ganz  Overall looks better today after  warming and resuscitation in ICU.  Brisk AT signals now and feet motor and sensory intact.  Will recheck labs this am to monitor Hgb and acidosis - profound hypotension in OR yesterday with acidosis - seems to be clinically improving.  Marty Heck 06/09/2019 7:59 AM --

## 2019-06-09 NOTE — Progress Notes (Signed)
Dr. Carlis Abbott paged regarding patient's progress for the day as well as new onset heel pain + low UOP. Give pain meds per orders.  Continue to monitor clinically.  AT pulses brisk with doppler.  Labs for morning ordered.  RN will continue to monitor.

## 2019-06-09 NOTE — Progress Notes (Signed)
CRITICAL VALUE ALERT  Critical Value: Lactic Acid 4.5   Date & Time Notied: 06/09/2019 0830   Orders Received/Actions taken: Expected value trending down

## 2019-06-10 DIAGNOSIS — I480 Paroxysmal atrial fibrillation: Secondary | ICD-10-CM

## 2019-06-10 LAB — CBC WITH DIFFERENTIAL/PLATELET
Abs Immature Granulocytes: 0.07 10*3/uL (ref 0.00–0.07)
Basophils Absolute: 0 10*3/uL (ref 0.0–0.1)
Basophils Relative: 0 %
Eosinophils Absolute: 0 10*3/uL (ref 0.0–0.5)
Eosinophils Relative: 0 %
HCT: 21.3 % — ABNORMAL LOW (ref 39.0–52.0)
Hemoglobin: 6.9 g/dL — CL (ref 13.0–17.0)
Immature Granulocytes: 1 %
Lymphocytes Relative: 8 %
Lymphs Abs: 0.5 10*3/uL — ABNORMAL LOW (ref 0.7–4.0)
MCH: 28.9 pg (ref 26.0–34.0)
MCHC: 32.4 g/dL (ref 30.0–36.0)
MCV: 89.1 fL (ref 80.0–100.0)
Monocytes Absolute: 0.4 10*3/uL (ref 0.1–1.0)
Monocytes Relative: 6 %
Neutro Abs: 6 10*3/uL (ref 1.7–7.7)
Neutrophils Relative %: 85 %
Platelets: 53 10*3/uL — ABNORMAL LOW (ref 150–400)
RBC: 2.39 MIL/uL — ABNORMAL LOW (ref 4.22–5.81)
RDW: 19.9 % — ABNORMAL HIGH (ref 11.5–15.5)
WBC: 7 10*3/uL (ref 4.0–10.5)
nRBC: 0 % (ref 0.0–0.2)

## 2019-06-10 LAB — COMPREHENSIVE METABOLIC PANEL
ALT: 17 U/L (ref 0–44)
AST: 75 U/L — ABNORMAL HIGH (ref 15–41)
Albumin: 2.4 g/dL — ABNORMAL LOW (ref 3.5–5.0)
Alkaline Phosphatase: 49 U/L (ref 38–126)
Anion gap: 9 (ref 5–15)
BUN: 23 mg/dL (ref 8–23)
CO2: 24 mmol/L (ref 22–32)
Calcium: 7.2 mg/dL — ABNORMAL LOW (ref 8.9–10.3)
Chloride: 107 mmol/L (ref 98–111)
Creatinine, Ser: 1.63 mg/dL — ABNORMAL HIGH (ref 0.61–1.24)
GFR calc Af Amer: 46 mL/min — ABNORMAL LOW (ref >60)
GFR calc non Af Amer: 40 mL/min — ABNORMAL LOW (ref >60)
Glucose, Bld: 125 mg/dL — ABNORMAL HIGH (ref 70–99)
Potassium: 4 mmol/L (ref 3.5–5.1)
Sodium: 140 mmol/L (ref 135–145)
Total Bilirubin: 0.7 mg/dL (ref 0.3–1.2)
Total Protein: 4.3 g/dL — ABNORMAL LOW (ref 6.5–8.1)

## 2019-06-10 LAB — LACTIC ACID, PLASMA: Lactic Acid, Venous: 3.3 mmol/L (ref 0.5–1.9)

## 2019-06-10 LAB — CBC
HCT: 25.7 % — ABNORMAL LOW (ref 39.0–52.0)
Hemoglobin: 8.2 g/dL — ABNORMAL LOW (ref 13.0–17.0)
MCH: 28.4 pg (ref 26.0–34.0)
MCHC: 31.9 g/dL (ref 30.0–36.0)
MCV: 88.9 fL (ref 80.0–100.0)
Platelets: 68 10*3/uL — ABNORMAL LOW (ref 150–400)
RBC: 2.89 MIL/uL — ABNORMAL LOW (ref 4.22–5.81)
RDW: 20.1 % — ABNORMAL HIGH (ref 11.5–15.5)
WBC: 12.6 10*3/uL — ABNORMAL HIGH (ref 4.0–10.5)
nRBC: 0 % (ref 0.0–0.2)

## 2019-06-10 MED ORDER — DILTIAZEM HCL 25 MG/5ML IV SOLN
10.0000 mg | Freq: Once | INTRAVENOUS | Status: DC
  Filled 2019-06-10: qty 5

## 2019-06-10 MED ORDER — METOPROLOL TARTRATE 5 MG/5ML IV SOLN
5.0000 mg | Freq: Four times a day (QID) | INTRAVENOUS | Status: DC
  Administered 2019-06-10 – 2019-06-15 (×16): 5 mg via INTRAVENOUS
  Filled 2019-06-10 (×16): qty 5

## 2019-06-10 NOTE — Consult Note (Addendum)
Cardiology Consultation:   Patient ID: Ryan Jackson MRN: [id number]; DOB: Oct 23, 1941  Admit date: 06/08/2019 Date of Consult: 06/10/2019  Primary Care Provider: [facility] Primary Cardiologist: No primary care provider on file.  Primary Electrophysiologist:  None    Patient Profile:   Ryan Jackson is a 78 y.o. male with a hx of HTN, HL, tobacco use and Abdominal iliac AA  who is being seen today for the evaluation of Afib at the request of Dr. Carlis Abbott.  History of Present Illness:   Ryan Jackson is a 78 yo male with PMH noted above. He is followed by the [place]. Does not have a cardiologist there. 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 He did have bilateral lower extremity claudication. ABI's at the [place] reported 0.53 on the right and 0.44 left. Echo done 04/20/19 showed normal EF with no rWMA. At his office visit options were discussed and patient opted for surgical repair.   Presented for surgery on 06/08/19 with Dr. Carlis Abbott complicated by profound hypotension. Post op was noted to be acidotic with pH of 7.1. He was given 2 units of FFP for INR of 2.1, along with platelets for platelet count of 71,000. Able to wean from all drips on 7/28. Developed some confusion the following day but no neuro deficits per notes. Seen by [facility] this morning and felt to be improving overall. This afternoon he developed Afib RVR on telemetry starting around 2pm. Was given 5mg  lopressor, initially did not resolve. Additional Dilt 10mg  IV push ordered, but converted to ST prior to administration. Patient is a poor historian, currently confused. During episode of Afib rates increased to 170s. Staff reports he did reach for his chest during this episode but did not complain of any specific symptoms. Current in sinus tach with rates in the 110s.   Heart Pathway Score:     Past Medical History:  Diagnosis  Date  . AAA (abdominal aortic aneurysm) (HCC)    3.0 cm with focal dissection at proximal infrarenal segment with moderate stenosis 02/2019  . Cancer ([place])    SKIN CNACER ON BACK  . Hypertension   . 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 PAD (peripheral artery disease) ([place])     Past Surgical History:  Procedure Laterality Date  . ABDOMINAL AORTIC ANEURYSM REPAIR N/A 06/08/2019   Procedure: OPEN ANEURYSM ABDOMINAL AORTIC REPAIR with Aorta Bifemoral Bypass Graft using Hemashield Gold Graft;  Surgeon: Marty Heck, MD;  Location: [facility];  Service: Vascular;  Laterality: N/A;  . LYSIS OF ADHESION N/A 06/08/2019   Procedure: Extensive Lysis Of Adhesion;  Surgeon: Marty Heck, MD;  Location: [facility];  Service: Vascular;  Laterality: N/A;  . SMALL INTESTINE SURGERY       Home Medications:  Prior to Admission medications   Medication Sig Start Date End Date Taking? Authorizing Provider  aspirin EC 81 MG tablet Take 81 mg by mouth daily.   Yes [provider]  carboxymethylcellulose (REFRESH PLUS) 0.5 % SOLN Place 1 drop into both eyes 3 (three) times daily as needed (dry eyes).   Yes [provider]  clopidogrel (PLAVIX) 75 MG tablet Take 75 mg by mouth daily.   Yes [provider]  diphenhydramine-acetaminophen (TYLENOL PM) 25-500 MG TABS tablet Take 1 tablet by mouth at bedtime.   Yes [provider]  ferrous sulfate 325 (65 FE)  MG tablet Take 325 mg by mouth daily with breakfast.   Yes [provider]  hydrocortisone 2.5 % cream Apply 1 application topically daily as needed (itching).     [provider]  lisinopril (ZESTRIL) 20 MG tablet Take 20 mg by mouth daily.    [provider]  naproxen sodium (ALEVE) 220 MG tablet Take 220 mg by mouth daily as needed (pain).    [provider]  omeprazole (PRILOSEC) 40 MG capsule Take 40 mg by mouth daily.    [provider]    Inpatient Medications: Scheduled  Meds: . sodium chloride   Intravenous Once  . sodium chloride   Intravenous Once  . Chlorhexidine Gluconate Cloth  6 each Topical Daily  . diltiazem  10 mg Intravenous Once  . docusate sodium  100 mg Oral Daily  . mouth rinse  15 mL Mouth Rinse BID  . metoprolol tartrate  2.5 mg Intravenous Q6H  . pantoprazole  40 mg Oral QHS   Or  . pantoprazole (PROTONIX) IV  40 mg Intravenous QHS   Continuous Infusions: . dextrose 5 % and 0.45% NaCl 125 mL/hr at 06/10/19 1133  . magnesium sulfate bolus IVPB     PRN Meds: acetaminophen **OR** acetaminophen, bisacodyl, guaiFENesin-dextromethorphan, hydrALAZINE, labetalol, magnesium sulfate bolus IVPB, metoprolol tartrate, morphine injection, ondansetron, phenol, polyethylene glycol, polyvinyl alcohol  Allergies:    Allergies  Allergen Reactions  . Mevacor [Lovastatin]     Unknown reaction  . Zocor [Simvastatin]     Unknown reaction    Social History:   Social History   Socioeconomic History  . Marital status: Married    Spouse name: Not on file  . Number of children: Not on file  . Years of education: Not on file  . Highest education level: Not on file  Occupational History  . Not on file  Social Needs  . Financial resource strain: Not on file  . Food insecurity    Worry: Not on file    Inability: Not on file  . Transportation needs    Medical: Not on file    Non-medical: Not on file  Tobacco Use  . Smoking status: Former Smoker    Types: Cigarettes    Quit date: 2010    Years since quitting: 10.5  . Smokeless tobacco: Never Used  Substance and Sexual Activity  . Alcohol use: Not Currently  . Drug use: Never  . Sexual activity: Not on file  Lifestyle  . Physical activity    Days per week: Not on file    Minutes per session: Not on file  . Stress: Not on file  Relationships  . Social Herbalist on phone: Not on file    Gets together: Not on file    Attends religious service: Not on file    Active member of  club or organization: Not on file    Attends meetings of clubs or organizations: Not on file    Relationship status: Not on file  . Intimate partner violence    Fear of current or ex partner: Not on file    Emotionally abused: Not on file    Physically abused: Not on file    Forced sexual activity: Not on file  Other Topics Concern  . Not on file  Social History Narrative  . Not on file    Family History:    Family History  Family history unknown: Yes     ROS:  Please  see the history of present illness.   All other ROS reviewed and negative.     Physical Exam/Data:   Vitals:   06/10/19 1141 06/10/19 1200 06/10/19 1300 06/10/19 1400  BP:  (!) 147/58 119/61 130/69  Pulse:  64 92 (!) 102  Resp:  20 14 18   Temp: 98.1 F (36.7 C)     TempSrc: Oral     SpO2:  98% 100% 97%  Weight:      Height:        Intake/Output Summary (Last 24 hours) at 06/10/2019 1529 Last data filed at 06/10/2019 1100 Gross per 24 hour  Intake 1875 ml  Output 710 ml  Net 1165 ml   Last 3 Weights 06/09/2019 06/08/2019 05/27/2019  Weight (lbs) 184 lb 11.9 oz 164 lb 4.8 oz 164 lb 4.8 oz  Weight (kg) 83.8 kg 74.526 kg 74.526 kg     Body mass index is 28.94 kg/m.  General:  Well nourished, well developed, in no acute distress. Confused. HEENT: normal Lymph: no adenopathy Neck: no JVD Vascular: No carotid bruits Cardiac:  normal S1, S2; ST no murmur  Lungs:  clear to auscultation bilaterally, no wheezing, rhonchi or rales  Abd: soft, nontender, no hepatomegaly  Ext: no edema Musculoskeletal:  No deformities, BUE and BLE strength normal and equal Skin: warm and dry  Neuro:  CNs 2-12 intact, no focal abnormalities noted Psych:  Normal affect   EKG:  The EKG was personally reviewed and demonstrates:  SR with prolonged QT interval  Telemetry:  Telemetry was personally reviewed and demonstrates:  ST--> Afib RVR-->ST  Relevant CV Studies:  TTE: 04/20/19  IMPRESSIONS    1. The left ventricle  has normal systolic function with an ejection fraction of 60-65%. The cavity size was normal. Left ventricular diastolic parameters were normal. No evidence of left ventricular regional wall motion abnormalities.  2. The right ventricle has normal systolic function. The cavity was normal. There is no increase in right ventricular wall thickness.  3. Left atrial size was mildly dilated.  4. The interatrial septum appears to be lipomatous.  Laboratory Data:  High Sensitivity Troponin:  No results for input(s): TROPONINIHS in the last 720 hours.   Cardiac EnzymesNo results for input(s): TROPONINI in the last 168 hours. No results for input(s): TROPIPOC in the last 168 hours.  Chemistry Recent Labs  Lab 06/08/19 2358 06/09/19 0759 06/09/19 0807 06/10/19 0021  NA 143 141 142 140  K 3.6 3.9 3.8 4.0  CL 111 109  --  107  CO2 17* 21*  --  24  GLUCOSE 244* 202*  --  125*  BUN 16 18  --  23  CREATININE 1.54* 1.55*  --  1.63*  CALCIUM 6.9* 6.9*  --  7.2*  GFRNONAA 43* 42*  --  40*  GFRAA 49* 49*  --  46*  ANIONGAP 15 11  --  9    Recent Labs  Lab 06/08/19 2358 06/09/19 0759 06/10/19 0021  PROT 4.1* 4.0* 4.3*  ALBUMIN 2.5* 2.5* 2.4*  AST 53* 63* 75*  ALT 15 18 17   ALKPHOS 40 43 49  BILITOT 0.6 0.5 0.7   Hematology Recent Labs  Lab 06/09/19 0759 06/09/19 0807 06/09/19 1451 06/10/19 0021  WBC 12.4*  --  15.1* 12.6*  RBC 3.28*  --  3.36* 2.89*  HGB 9.3* 9.2* 9.5* 8.2*  HCT 28.6* 27.0* 29.6* 25.7*  MCV 87.2  --  88.1 88.9  MCH 28.4  --  28.3  28.4  MCHC 32.5  --  32.1 31.9  RDW 19.7*  --  20.2* 20.1*  PLT 80*  --  80* 68*   BNPNo results for input(s): BNP, PROBNP in the last 168 hours.  DDimer No results for input(s): DDIMER in the last 168 hours.   Radiology/Studies:  Dg Chest Port 1 View  Result Date: 06/09/2019 CLINICAL DATA:  AAA repair EXAM: PORTABLE CHEST 1 VIEW COMPARISON:  06/08/2019 FINDINGS: NG tube removed. Swan-Ganz catheter tip right lower pulmonary artery  unchanged. Increase in bibasilar atelectasis with decreased lung volume. Negative for heart failure or edema. Small left effusion. IMPRESSION: Decreased lung volume with increase in bibasilar atelectasis compared with yesterday. Electronically Signed   By: Franchot Gallo M.D.   On: 06/09/2019 08:24   Dg Chest Port 1 View  Result Date: 06/08/2019 CLINICAL DATA:  Status post abdominal aortic aneurysm repair. EXAM: PORTABLE CHEST 1 VIEW COMPARISON:  08/24/2012 FINDINGS: A right jugular Swan-Ganz catheter terminates in the region of the distal inter lobar artery or right lower lobe pulmonary artery. An enteric tube terminates over the gastric body. The cardiomediastinal silhouette is within normal limits. The lungs are hypoinflated without evidence of airspace consolidation, edema, sizable pleural effusion, pneumothorax. No acute osseous abnormality is seen. IMPRESSION: 1. Support devices as above. 2. Hypoinflation without evidence of acute airspace disease. Electronically Signed   By: Logan Bores M.D.   On: 06/08/2019 17:06   Dg Abd Portable 1v  Result Date: 06/08/2019 CLINICAL DATA:  AAA repair EXAM: PORTABLE ABDOMEN - 1 VIEW COMPARISON:  Same-day radiograph FINDINGS: There are a few mildly prominent gas-filled loops of small bowel measuring up to 3.5 cm in the central abdomen. Air is present distally within the colon. Enteric tube is coiled within the stomach. Multiple linear metallic densities within the lower abdomen and pelvis, favor surgical clips. No additional metallic foreign bodies identified. IMPRESSION: Mildly dilated loops of small bowel the central abdomen measuring up to 3.5 cm. Findings favored to represent postoperative ileus. Early obstruction not excluded. Continued radiographic follow-up, as clinically indicated. Electronically Signed   By: Davina Poke M.D.   On: 06/08/2019 17:00   Dg Or Local Abdomen  Result Date: 06/08/2019 CLINICAL DATA:  Incorrect needle count. EXAM: OR LOCAL  ABDOMEN COMPARISON:  11/27/2018. FINDINGS: NG tube noted coiled stomach. Multiple tiny linear metallic densities are noted throughout the pelvis. These have the appearance of clips as opposed to a suture needle. No bowel distention. No free air. IMPRESSION: Surgical clips noted in the abdomen and pelvis. No definite retained suture needle is identified. Report phoned to the OR at the time of the procedure. Electronically Signed   By: Marcello Moores  Register   On: 06/08/2019 14:17    Assessment and Plan:   Ryan Jackson is a 78 y.o. male with a hx of HTN, HL, tobacco use and Abdominal iliac AA  who is being seen today for the evaluation of Afib at the request of Dr. Carlis Abbott.  1. Afib RVR: developed brief episode this afternoon. Did resolve with IV lopressor, Dilt ordered but converted prior administration. Seemed to be asymptomatic per staff. Now back in ST, rate 110s. Given brief episode, post op, will hold on anticoagulation at this time. If develops recurrence, would consider adding as he does have risk factors.   -- will increase metoprolol to 5mg  IV q6hr. Prefer metoprolol to Dilt given AAA repair. Convert to oral dosing tomorrow if taking orals.   2. Iliac AAA: s/p aorto-bifem  bypass with Dr. Carlis Abbott.    3. HTN: stable. Rec's as above   For questions or updates, please contact [place] Please consult [email or web address] for contact info under     Signed, Reino Bellis, NP  06/10/2019 3:29 PM   Attending Note:   The patient was seen and examined.  Agree with assessment and plan as noted above.  Changes made to the above note as needed.  Patient seen and independently examined with Reino Bellis, NP .   We discussed all aspects of the encounter. I agree with the assessment and plan as stated above.  1.  Paroxysmal atrial fib:  Had a brief episode of  PAF today - 1 day following iliac aneurism repair.   Is still confused following anesthesia.  He has no angina.    Is able to converse  without any trouble   At this point, I do not think he necessarily needs to be started on anticoagulation.   This episode was very brief and occurred the day following vascular surgery.    Echo from June shows normal LV systolic function.   Continue IV metoprolol PRN.   Will start metoprolol 25 PO BID once he is taking food.   Will follow along.  If he has additional episodes of AF, will need to consider starting anticoagulation.    2.  HTN:  BP is well controlled.   3.   Abdominal iliac aneurism:   Will start metoprolol .    I have spent a total of 40 minutes with patient reviewing hospital  notes , telemetry, EKGs, labs and examining patient as well as establishing an assessment and plan that was discussed with the patient. > 50% of time was spent in direct patient care.    Thayer Headings, Brooke Bonito., MD, [facility] 06/10/2019, 4:43 PM [address]  [place] Pager [phone]

## 2019-06-10 NOTE — Progress Notes (Signed)
Physical Therapy Treatment Patient Details Name: Ryan Jackson MRN: [id number] DOB: 03-11-1941 Today's Date: 06/10/2019    History of Present Illness Ryan Jackson is a 78 y.o. 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  He presents today to further discuss surgery with his wife after echocardiogram.  7/27 s/p lysis of abdominal adhesions, AAA repair with aortobifemoral bypass, R common femoral art endarterectomy.    PT Comments    Pt still lethargic, difficult when most alert to get him to focus on task.  Emphasis on sit to stand and pre-gait/standing activity when pt was unable to walk due to unstable knees.   Follow Up Recommendations  SNF;Supervision/Assistance - 24 hour     Equipment Recommendations  None recommended by PT;Other (comment)(TBA)    Recommendations for Other Services       Precautions / Restrictions Precautions Precautions: Fall    Mobility  Bed Mobility Overal bed mobility: Needs Assistance             General bed mobility comments: OOB in chair upon arrival  Transfers Overall transfer level: Needs assistance Equipment used: Rolling walker (2 wheeled) Transfers: Sit to/from Stand Sit to Stand: Max assist;+2 physical assistance         General transfer comment: x3  Each trial pt's facial express would change in stance significantly and pt stated on one trial that he was "going blind"  BP showing a 30 point change from sitting to standing suggesting orthostatic BP  Ambulation/Gait             General Gait Details: unable to get pt stable enough in standing to progress to gait..  Pt's r>L knee buckling in standing and continued when pt w/shifted and tried to take a step.   Stairs             Wheelchair Mobility    Modified Rankin (Stroke Patients  Only)       Balance Overall balance assessment: Needs assistance   Sitting balance-Leahy Scale: Fair       Standing balance-Leahy Scale: Poor Standing balance comment: pt needed AD and significant external support.  Still pt's knees buckles multiple times.                            Cognition Arousal/Alertness: Lethargic Behavior During Therapy: Flat affect Overall Cognitive Status: No family/caregiver present to determine baseline cognitive functioning(wife reports he is independent, but can get confused)                                        Exercises      General Comments General comments (skin integrity, edema, etc.): still unable to ambulate at this point.        Pertinent Vitals/Pain Pain Assessment: Faces Faces Pain Scale: Hurts little more Pain Location: incisional Pain Descriptors / Indicators: Grimacing;Moaning;Guarding Pain Intervention(s): Monitored during session    Home Living                      Prior Function            PT Goals (current goals can now be found in the care plan section) Acute Rehab  PT Goals Patient Stated Goal: none stated PT Goal Formulation: Patient unable to participate in goal setting Time For Goal Achievement: 06/23/19 Potential to Achieve Goals: Fair Progress towards PT goals: Progressing toward goals    Frequency    Min 3X/week      PT Plan Current plan remains appropriate    Co-evaluation              AM-PAC PT "6 Clicks" Mobility   Outcome Measure  Help needed turning from your back to your side while in a flat bed without using bedrails?: A Little Help needed moving from lying on your back to sitting on the side of a flat bed without using bedrails?: A Little Help needed moving to and from a bed to a chair (including a wheelchair)?: A Lot Help needed standing up from a chair using your arms (e.g., wheelchair or bedside chair)?: A Lot Help needed to walk in  hospital room?: A Lot Help needed climbing 3-5 steps with a railing? : Total 6 Click Score: 13    End of Session       Nurse Communication: Mobility status PT Visit Diagnosis: Difficulty in walking, not elsewhere classified (R26.2);Other symptoms and signs involving the nervous system (R29.898);Other abnormalities of gait and mobility (R26.89)     Time: 9643-8381 PT Time Calculation (min) (ACUTE ONLY): 22 min  Charges:  $Therapeutic Activity: 8-22 mins                     06/10/2019  Ryan Jackson, PT Acute Rehabilitation Services [phone]  (pager) [phone]  (office)   Ryan Jackson 06/10/2019, 3:15 PM

## 2019-06-10 NOTE — Progress Notes (Signed)
[facility]  Subjective  - acidosis improved.  Hemodynamically stable overnight.   Objective (!) 119/52 89 98.7 F (37.1 C) (Axillary) 10 100%  Intake/Output Summary (Last 24 hours) at 06/10/2019 0746 Last data filed at 06/10/2019 0600 Gross per 24 hour  Intake 2734.57 ml  Output 625 ml  Net 2109.57 ml    Mottling on abdominal wall and both thighs almost completely resolved - looks much better Brisk AT signals bilaterally Feet motor and sensory intact Midline and groin incisions c/d/i Scrotal swelling with some bruising  Laboratory Lab Results: Recent Labs    06/09/19 1451 06/10/19 0021  WBC 15.1* 12.6*  HGB 9.5* 8.2*  HCT 29.6* 25.7*  PLT 80* 68*   BMET Recent Labs    06/09/19 0759 06/09/19 0807 06/10/19 0021  NA 141 142 140  K 3.9 3.8 4.0  CL 109  --  107  CO2 21*  --  24  GLUCOSE 202*  --  125*  BUN 18  --  23  CREATININE 1.55*  --  1.63*  CALCIUM 6.9*  --  7.2*    COAG Lab Results  Component Value Date   INR 1.5 (H) 06/08/2019   INR 2.1 (H) 06/08/2019   INR 1.0 05/27/2019   No results found for: PTT  Assessment/Planning: POD #2 s/p open AAA repair with aorto-bifemoral bypass.  N: GCS 14, ongoing confusion, no other neuro deficits, moving legs well CV: No drips, Hgb 9.5 --> 8.2, no blood products overnight, got 2 units FFP and 1 unit platelets in ICU POD#0 - INR 1.5 now from 2.1, platelets fown again to 68 --- will recheck CBC this evening, HDS at this time GI: NPO, pulled NG, will hold on replacing at this time, abdomen soft, no bowel movement yet Renal: High risk for ATN given profound hypotension in OR, UOP 25 - 50 mL/hr, will monitor, IVF 125 mL today, last Cr bumped 1.55 --> 1.63, keep foley, strict I&O ID: Ancef for post-op prophylaxis FEN: IVF 125, lytes ok, acidosis improving - lactic acid 9+ --> 3.3 Dispo:Keep in ICU today - mental status biggest issue at this time.  Overall looks good except for  confusion.  Brisk AT signals bilaterally.  Will recheck CBC this evening to monitor Hgb and platelets.  Marty Heck 06/10/2019 7:46 AM --

## 2019-06-10 NOTE — Addendum Note (Signed)
Addendum  created 06/10/19 1010 by Henchy Mccauley, Dahlia Bailiff, CRNA   Intraprocedure Event edited

## 2019-06-11 LAB — CBC
HCT: 25.5 % — ABNORMAL LOW (ref 39.0–52.0)
Hemoglobin: 8 g/dL — ABNORMAL LOW (ref 13.0–17.0)
MCH: 28.3 pg (ref 26.0–34.0)
MCHC: 31.4 g/dL (ref 30.0–36.0)
MCV: 90.1 fL (ref 80.0–100.0)
Platelets: 59 10*3/uL — ABNORMAL LOW (ref 150–400)
RBC: 2.83 MIL/uL — ABNORMAL LOW (ref 4.22–5.81)
RDW: 18.6 % — ABNORMAL HIGH (ref 11.5–15.5)
WBC: 8.1 10*3/uL (ref 4.0–10.5)
nRBC: 0 % (ref 0.0–0.2)

## 2019-06-11 LAB — LIPID PANEL
Cholesterol: 100 mg/dL (ref 0–200)
HDL: 16 mg/dL — ABNORMAL LOW (ref >40)
LDL Cholesterol: 49 mg/dL (ref 0–99)
Total CHOL/HDL Ratio: 6.3 RATIO
Triglycerides: 175 mg/dL — ABNORMAL HIGH (ref <150)
VLDL: 35 mg/dL (ref 0–40)

## 2019-06-11 LAB — GLUCOSE, CAPILLARY: Glucose-Capillary: 85 mg/dL (ref 70–99)

## 2019-06-11 LAB — PREPARE RBC (CROSSMATCH)

## 2019-06-11 MED ORDER — BOOST / RESOURCE BREEZE PO LIQD CUSTOM
1.0000 | Freq: Three times a day (TID) | ORAL | Status: DC
  Administered 2019-06-11 – 2019-06-16 (×13): 1 via ORAL

## 2019-06-11 NOTE — Progress Notes (Signed)
Occupational Therapy Treatment Patient Details Name: Ryan Jackson MRN: [id number] DOB: 1941/03/06 Today's Date: 06/11/2019    History of present illness Ryan Jackson is a 78 y.o. 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  He presents today to further discuss surgery with his wife after echocardiogram.  7/27 s/p lysis of abdominal adhesions, AAA repair with aortobifemoral bypass, R common femoral art endarterectomy.   OT comments  Pt with stable VS throughout session. Demonstrating poor sitting balance with leaning to L. Able to stand with less assist today. Pt remains confused. Assisted pt to low bed with RN.  Follow Up Recommendations  SNF;Supervision/Assistance - 24 hour    Equipment Recommendations  3 in 1 bedside commode    Recommendations for Other Services      Precautions / Restrictions Precautions Precautions: Fall Restrictions Weight Bearing Restrictions: No       Mobility Bed Mobility Overal bed mobility: Needs Assistance Bed Mobility: Rolling;Sidelying to Sit;Sit to Sidelying Rolling: Min guard Sidelying to sit: Mod assist     Sit to sidelying: Mod assist General bed mobility comments: rolled to minimize pain, use of rail, assist to raise trunk and for LEs into bed  Transfers Overall transfer level: Needs assistance Equipment used: Rolling walker (2 wheeled) Transfers: Sit to/from Stand Sit to Stand: Mod assist;+2 safety/equipment         General transfer comment: stood from bed and took several steps to [place] Overall balance assessment: Needs assistance   Sitting balance-Leahy Scale: Poor Sitting balance - Comments: leaning L      Standing balance-Leahy Scale: Poor                             ADL either performed or assessed with clinical  judgement   ADL Overall ADL's : Needs assistance/impaired Eating/Feeding: Set up;Bed level               Upper Body Dressing : Minimal assistance;Bed level   Lower Body Dressing: Total assistance;Sitting/lateral leans                       Vision       Perception     Praxis      Cognition Arousal/Alertness: Awake/alert Behavior During Therapy: Impulsive Overall Cognitive Status: Impaired/Different from baseline Area of Impairment: Orientation;Attention;Memory;Following commands;Safety/judgement;Problem solving;Awareness                 Orientation Level: Disoriented to;Situation;Time;Place Current Attention Level: Selective Memory: Decreased short-term memory Following Commands: Follows one step commands with increased time Safety/Judgement: Decreased awareness of safety;Decreased awareness of deficits Awareness: Intellectual Problem Solving: Slow processing;Decreased initiation;Difficulty sequencing;Requires verbal cues;Requires tactile cues          Exercises     Shoulder Instructions       General Comments      Pertinent Vitals/ Pain       Pain Assessment: Faces Faces Pain Scale: No hurt  Home Living                                          Prior Functioning/Environment  Frequency  Min 2X/week        Progress Toward Goals  OT Goals(current goals can now be found in the care plan section)  Progress towards OT goals: Progressing toward goals  Acute Rehab OT Goals Patient Stated Goal: to go home OT Goal Formulation: Patient unable to participate in goal setting Time For Goal Achievement: 06/23/19 Potential to Achieve Goals: [place] Discharge plan remains appropriate    Co-evaluation                 AM-PAC OT "6 Clicks" Daily Activity     Outcome Measure   Help from another person eating meals?: A Little Help from another person taking care of personal grooming?: A  Little Help from another person toileting, which includes using toliet, bedpan, or urinal?: Total Help from another person bathing (including washing, rinsing, drying)?: A Lot Help from another person to put on and taking off regular upper body clothing?: A Little Help from another person to put on and taking off regular lower body clothing?: Total 6 Click Score: 13    End of Session Equipment Utilized During Treatment: Gait belt;Rolling walker  OT Visit Diagnosis: Other abnormalities of gait and mobility (R26.89);Muscle weakness (generalized) (M62.81);Pain;Other symptoms and signs involving cognitive function   Activity Tolerance Patient tolerated treatment well   Patient Left in bed;with call bell/phone within reach;with bed alarm set(assisted with transferring pt to low bed)   Nurse Communication Mobility status        Time: 2263-3354 OT Time Calculation (min): 33 min  Charges: OT General Charges $OT Visit: 1 Visit OT Treatments $Self Care/Home Management : 8-22 mins $Therapeutic Activity: 8-22 mins  Nestor Lewandowsky, OTR/L Acute Rehabilitation Services Pager: [phone] Office: [phone]   Malka So 06/11/2019, 11:29 AM

## 2019-06-11 NOTE — Progress Notes (Signed)
Spoke with RN re d/c CVC.  RN aware.

## 2019-06-11 NOTE — Progress Notes (Signed)
Progress Note  Patient Name: Ryan Jackson Date of Encounter: 06/11/2019  Primary Cardiologist:  New , Rosie Golson   Subjective   Pt has remained in NSR ,  Still confused.   Inpatient Medications    Scheduled Meds: . sodium chloride   Intravenous Once  . sodium chloride   Intravenous Once  . Chlorhexidine Gluconate Cloth  6 each Topical Daily  . diltiazem  10 mg Intravenous Once  . docusate sodium  100 mg Oral Daily  . mouth rinse  15 mL Mouth Rinse BID  . metoprolol tartrate  5 mg Intravenous Q6H  . pantoprazole  40 mg Oral QHS   Or  . pantoprazole (PROTONIX) IV  40 mg Intravenous QHS   Continuous Infusions: . dextrose 5 % and 0.45% NaCl 125 mL/hr at 06/10/19 2035   PRN Meds: acetaminophen **OR** acetaminophen, bisacodyl, guaiFENesin-dextromethorphan, hydrALAZINE, labetalol, metoprolol tartrate, morphine injection, ondansetron, phenol, polyethylene glycol, polyvinyl alcohol   Vital Signs    Vitals:   06/11/19 0500 06/11/19 0515 06/11/19 0600 06/11/19 0700  BP:  (!) 110/91 121/64   Pulse: (!) 102 96 86 82  Resp: 17 18 14  (!) 21  Temp:      TempSrc:      SpO2: 95% 97% 97% 95%  Weight:      Height:        Intake/Output Summary (Last 24 hours) at 06/11/2019 0833 Last data filed at 06/11/2019 0800 Gross per 24 hour  Intake 3183.33 ml  Output 1850 ml  Net 1333.33 ml   Last 3 Weights 06/09/2019 06/08/2019 05/27/2019  Weight (lbs) 184 lb 11.9 oz 164 lb 4.8 oz 164 lb 4.8 oz  Weight (kg) 83.8 kg 74.526 kg 74.526 kg      Telemetry     NSR - Personally Reviewed  ECG    NSR  - Personally Reviewed  Physical Exam   GEN: No acute distress.   Neck: No JVD Cardiac: RRR, no murmurs, rubs, or gallops.  Respiratory: Clear to auscultation bilaterally. GI: Soft, nontender, non-distended  MS: No edema; No deformity. Neuro:  Nonfocal  Psych: Normal affect   Labs    High Sensitivity Troponin:  No results for input(s): TROPONINIHS in the last 720 hours.    Cardiac  EnzymesNo results for input(s): TROPONINI in the last 168 hours. No results for input(s): TROPIPOC in the last 168 hours.   Chemistry Recent Labs  Lab 06/08/19 2358 06/09/19 0759 06/09/19 0807 06/10/19 0021  NA 143 141 142 140  K 3.6 3.9 3.8 4.0  CL 111 109  --  107  CO2 17* 21*  --  24  GLUCOSE 244* 202*  --  125*  BUN 16 18  --  23  CREATININE 1.54* 1.55*  --  1.63*  CALCIUM 6.9* 6.9*  --  7.2*  PROT 4.1* 4.0*  --  4.3*  ALBUMIN 2.5* 2.5*  --  2.4*  AST 53* 63*  --  75*  ALT 15 18  --  17  ALKPHOS 40 43  --  49  BILITOT 0.6 0.5  --  0.7  GFRNONAA 43* 42*  --  40*  GFRAA 49* 49*  --  46*  ANIONGAP 15 11  --  9     Hematology Recent Labs  Lab 06/10/19 0021 06/10/19 2257 06/11/19 0559  WBC 12.6* 7.0 8.1  RBC 2.89* 2.39* 2.83*  HGB 8.2* 6.9* 8.0*  HCT 25.7* 21.3* 25.5*  MCV 88.9 89.1 90.1  MCH 28.4 28.9  28.3  MCHC 31.9 32.4 31.4  RDW 20.1* 19.9* 18.6*  PLT 68* 53* 59*    BNPNo results for input(s): BNP, PROBNP in the last 168 hours.   DDimer No results for input(s): DDIMER in the last 168 hours.   Radiology    No results found.  Cardiac Studies     Patient Profile     78 y.o. male with PAF   Assessment & Plan    1.   PAF :  No recurrence.   Continue metoprolol.  Will change to PO . Cont other meds.   Continue to follow     For questions or updates, please contact [place] Please consult [email or web address] for contact info under        Signed, Mertie Moores, MD  06/11/2019, 8:33 AM

## 2019-06-11 NOTE — Progress Notes (Signed)
Called by nursing in [place] that [name] fell out of bed this afternoon and was found facedown.  This was an unwitnessed fall.  He was put back in bed with no obvious trauma.  Remained hemodynamically stable.  Sitter is now at bedside.  He has had ongoing confusion since his open abdominal aneurysm repair earlier this week.  I went to examine him myself and his midline incision as well as both groin incisions are clean and dry.  He has brisk AT signals in both feet.  He has no obvious neurologic deficit.  He is actually the most oriented that I have seen him he can tell me he is in the hospital after aneurysm surgery and his name is Ryan Jackson.  Discussed holding on transfer for now so that we can monitor him after his fall and will continue to have a sitter at bedside.  Marty Heck, MD [facility] Office: [phone] Pager: [place]

## 2019-06-11 NOTE — Progress Notes (Signed)
[facility] [place]  Subjective  - Afib with RVR yesterday and appreciate cardiology input.  Converted after metoprolol.  No recurrent afib overnight.  Objective 121/64 82 98.4 F (36.9 C) (Oral) (!) 21 95%  Intake/Output Summary (Last 24 hours) at 06/11/2019 0941 Last data filed at 06/11/2019 0800 Gross per 24 hour  Intake 3058.33 ml  Output 1850 ml  Net 1208.33 ml   Mottling on abdominal wall and both thighs - resolved Brisk AT signals bilaterally Feet motor and sensory intact Midline and groin incisions c/d/i  Laboratory Lab Results: Recent Labs    06/10/19 2257 06/11/19 0559  WBC 7.0 8.1  HGB 6.9* 8.0*  HCT 21.3* 25.5*  PLT 53* 59*   BMET Recent Labs    06/09/19 0759 06/09/19 0807 06/10/19 0021  NA 141 142 140  K 3.9 3.8 4.0  CL 109  --  107  CO2 21*  --  24  GLUCOSE 202*  --  125*  BUN 18  --  23  CREATININE 1.55*  --  1.63*  CALCIUM 6.9*  --  7.2*    COAG Lab Results  Component Value Date   INR 1.5 (H) 06/08/2019   INR 2.1 (H) 06/08/2019   INR 1.0 05/27/2019   No results found for: PTT  Assessment/Planning: POD #3 s/p open AAA repair with aorto-bifemoral bypass.  N: GCS 14, ongoing confusion, no other neuro deficits, moving legs well CV: No drips, Hgb 6.9 --> 8.0, received 1upRBC overnight and appropriate response, HDS at this time, metoprolol scheduled for afib, converted after one dose yesterday, no need for anticoagulation at this time per cardilogy GI: +BM, will start clear liquis. Renal: High risk for ATN given profound hypotension in OR, UOP 1870 past 24 hours, will monitor, IVF 125 mL - decrease to 75, strict I&O ID: Ancef for post-op prophylaxis FEN: IVF 75, lytes ok, acidosis improving - lactic acid 9+ --> 3.3 Dispo: Floor today  Overall looks good except for confusion.  Brisk AT signals bilaterally.  Responded to 1 ublood.  HDS.  Appreciate cardiology input.  Marty Heck 06/11/2019 9:41  AM --

## 2019-06-11 NOTE — Progress Notes (Signed)
Initial Nutrition Assessment  DOCUMENTATION CODES:   Not applicable  INTERVENTION:    Boost Breeze po TID, each supplement provides 250 kcal and 9 grams of protein  NUTRITION DIAGNOSIS:   Increased nutrient needs related to post-op healing as evidenced by estimated needs.  GOAL:   Patient will meet greater than or equal to 90% of their needs  MONITOR:   PO intake, Supplement acceptance, Diet advancement, Labs, I & O's, Skin, Weight trends  REASON FOR ASSESSMENT:   Malnutrition Screening Tool    ASSESSMENT:   Patient with PMH significant for HTN and HLD. Presents this admission with aneurysm of the left distal common iliac artery with involvement of the proximal hypogastric.   7/29- open AAA repair with aorto-bifem bypass  Pt confused this am. Transferring out of unit during RD visit. Unable to obtain NFPE or dietary history.  Pt currently on clear liquid diet. Consumed 30% of his breakfast. RD to provide supplementation to maximize kcal and protein this admission.   Per chart records, pt's weight has been stable over the last two months.   Drips: D5 in 1/2NS @ 75 ml/hr  Medications: colace Labs: Cr 1.63- trending up  Diet Order:   Diet Order            Diet clear liquid Room service appropriate? Yes; Fluid consistency: Thin  Diet effective now              EDUCATION NEEDS:   Not appropriate for education at this time  Skin:  Skin Assessment: Skin Integrity Issues: Skin Integrity Issues:: Incisions Incisions: abdomen, R/L groin  Last BM:  PTA  Height:   Ht Readings from Last 1 Encounters:  06/08/19 5\' 7"  (1.702 m)    Weight:   Wt Readings from Last 1 Encounters:  06/09/19 83.8 kg    Ideal Body Weight:  67.3 kg  BMI:  Body mass index is 28.94 kg/m.  Estimated Nutritional Needs:   Kcal:  1900-2100 kcal  Protein:  95-115 grams  Fluid:  >/= 1.9 L/day   Mariana Single RD, LDN Clinical Nutrition Pager # - [phone]

## 2019-06-12 LAB — BASIC METABOLIC PANEL
Anion gap: 7 (ref 5–15)
BUN: 13 mg/dL (ref 8–23)
CO2: 26 mmol/L (ref 22–32)
Calcium: 7.3 mg/dL — ABNORMAL LOW (ref 8.9–10.3)
Chloride: 106 mmol/L (ref 98–111)
Creatinine, Ser: 1.11 mg/dL (ref 0.61–1.24)
GFR calc Af Amer: >60 mL/min (ref >60)
GFR calc non Af Amer: >60 mL/min (ref >60)
Glucose, Bld: 110 mg/dL — ABNORMAL HIGH (ref 70–99)
Potassium: 3.1 mmol/L — ABNORMAL LOW (ref 3.5–5.1)
Sodium: 139 mmol/L (ref 135–145)

## 2019-06-12 LAB — TYPE AND SCREEN
ABO/RH(D): O POS
Antibody Screen: NEGATIVE
Unit division: 0

## 2019-06-12 LAB — CBC
HCT: 25.9 % — ABNORMAL LOW (ref 39.0–52.0)
Hemoglobin: 8.6 g/dL — ABNORMAL LOW (ref 13.0–17.0)
MCH: 29.5 pg (ref 26.0–34.0)
MCHC: 33.2 g/dL (ref 30.0–36.0)
MCV: 88.7 fL (ref 80.0–100.0)
Platelets: 73 10*3/uL — ABNORMAL LOW (ref 150–400)
RBC: 2.92 MIL/uL — ABNORMAL LOW (ref 4.22–5.81)
RDW: 18.8 % — ABNORMAL HIGH (ref 11.5–15.5)
WBC: 6.9 10*3/uL (ref 4.0–10.5)
nRBC: 0 % (ref 0.0–0.2)

## 2019-06-12 LAB — BPAM RBC
Blood Product Expiration Date: 202008052359
ISSUE DATE / TIME: 202007300113
Unit Type and Rh: 5100

## 2019-06-12 MED ORDER — POTASSIUM CHLORIDE CRYS ER 10 MEQ PO TBCR
30.0000 meq | EXTENDED_RELEASE_TABLET | Freq: Once | ORAL | Status: AC
  Administered 2019-06-12: 30 meq via ORAL
  Filled 2019-06-12: qty 3

## 2019-06-12 MED FILL — Sodium Chloride Irrigation Soln 0.9%: Qty: 1000 | Status: AC

## 2019-06-12 MED FILL — Sodium Chloride IV Soln 0.9%: INTRAVENOUS | Qty: 1000 | Status: AC

## 2019-06-12 MED FILL — Heparin Sodium (Porcine) Inj 1000 Unit/ML: INTRAMUSCULAR | Qty: 30 | Status: AC

## 2019-06-12 NOTE — Progress Notes (Signed)
    Still in NSR.   [place] out of bed yesterday  No obvious trauma.  Continue metoprolol. I would not anticoagulate him based on what we have seen so far.   Had a brief run of A Fib following vascular surgery and has not had any recurrence.   In addition, he is still confused and fell yesterday  Will sign off. Call for questions  [facility] will sign off.   Medication Recommendations:  Continue current meds Other recommendations (labs, testing, etc):   Follow up as an outpatient:  With his medical doctor.   He can see [place] as needed.     Mertie Moores, MD  06/12/2019 8:39 AM    [address],  [place], [place]  [address] Pager [phone] Phone: [phone]; Fax: [phone]

## 2019-06-12 NOTE — Progress Notes (Signed)
Physical Therapy Treatment Patient Details Name: Ryan Jackson MRN: [id number] DOB: 19-Mar-1941 Today's Date: 06/12/2019    History of Present Illness Ryan Jackson is a 78 y.o. 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  He presents today to further discuss surgery with his wife after echocardiogram.  7/27 s/p lysis of abdominal adhesions, AAA repair with aortobifemoral bypass, R common femoral art endarterectomy.    PT Comments    Patient assisted BSC to Chair, today requiring mod A of 1, premature buckle  Cues for UE support on RW able to follow. Cont to rec SNF.     Follow Up Recommendations  SNF;Supervision/Assistance - 24 hour     Equipment Recommendations  None recommended by PT;Other (comment)(TBA)    Recommendations for Other Services       Precautions / Restrictions Precautions Precautions: Fall Restrictions Weight Bearing Restrictions: Yes    Mobility  Bed Mobility               General bed mobility comments: OOB at commode chair at time   Transfers Overall transfer level: Needs assistance Equipment used: Rolling walker (2 wheeled) Transfers: Sit to/from Stand Sit to Stand: Mod assist;+2 safety/equipment         General transfer comment: mod A to stand and pivot to transfer from [facility] to the chair, VSS but fatigues qucikly with buckle   Ambulation/Gait                 Stairs             Wheelchair Mobility    Modified Rankin (Stroke Patients Only)       Balance Overall balance assessment: Needs assistance Sitting-balance support: No upper extremity supported;Feet supported Sitting balance-Leahy Scale: Poor Sitting balance - Comments: leaning L      Standing balance-Leahy Scale: Poor Standing balance comment: pt needed AD and significant  external support.  Still pt's knees buckles multiple times.                            Cognition Arousal/Alertness: Awake/alert Behavior During Therapy: Impulsive Overall Cognitive Status: Impaired/Different from baseline Area of Impairment: Orientation;Attention;Memory;Following commands;Safety/judgement;Problem solving;Awareness                 Orientation Level: Disoriented to;Situation;Time;Place Current Attention Level: Selective Memory: Decreased short-term memory Following Commands: Follows one step commands with increased time Safety/Judgement: Decreased awareness of safety;Decreased awareness of deficits Awareness: Intellectual Problem Solving: Slow processing;Decreased initiation;Difficulty sequencing;Requires verbal cues;Requires tactile cues General Comments: pt oriented to name (not birthday, place, situation or time), inconsistently following commands, but noted pt with limited participation during sesson      Exercises      General Comments        Pertinent Vitals/Pain Pain Assessment: No/denies pain Pain Intervention(s): Limited activity within patient's tolerance    Home Living                      Prior Function            PT Goals (current goals can now be found in the care plan section) Acute Rehab PT Goals PT Goal Formulation: Patient unable to participate in goal setting Time For Goal Achievement: 06/23/19 Potential to Achieve Goals: Fair Progress towards  PT goals: Progressing toward goals    Frequency    Min 3X/week      PT Plan Current plan remains appropriate    Co-evaluation              AM-PAC PT "6 Clicks" Mobility   Outcome Measure  Help needed turning from your back to your side while in a flat bed without using bedrails?: A Little Help needed moving from lying on your back to sitting on the side of a flat bed without using bedrails?: A Little Help needed moving to and from a bed to a chair  (including a wheelchair)?: A Lot Help needed standing up from a chair using your arms (e.g., wheelchair or bedside chair)?: A Lot Help needed to walk in hospital room?: A Lot Help needed climbing 3-5 steps with a railing? : Total 6 Click Score: 13    End of Session   Activity Tolerance: Other (comment) Patient left: in chair;with call bell/phone within reach;Other (comment) Nurse Communication: Mobility status PT Visit Diagnosis: Difficulty in walking, not elsewhere classified (R26.2);Other symptoms and signs involving the nervous system (R29.898);Other abnormalities of gait and mobility (R26.89)     Time: 1700-1720 PT Time Calculation (min) (ACUTE ONLY): 20 min  Charges:  $Therapeutic Activity: 8-22 mins                    Reinaldo Berber, PT, DPT Acute Rehabilitation Services Pager: [phone] Office: [phone]    Reinaldo Berber 06/12/2019, 5:33 PM

## 2019-06-12 NOTE — Plan of Care (Signed)
  Problem: Education: Goal: Knowledge of General Education information will improve Description Including pain rating scale, medication(s)/side effects and non-pharmacologic comfort measures Outcome: Progressing   Problem: Health Behavior/Discharge Planning: Goal: Ability to manage health-related needs will improve Outcome: Progressing   

## 2019-06-12 NOTE — Progress Notes (Signed)
[facility] [place] yesterday evening.  Now has sitter.  HDS overnight.  No other active issues.  Objective (!) 131/49 70 99 F (37.2 C) (Oral) 14 96%  Intake/Output Summary (Last 24 hours) at 06/12/2019 0912 Last data filed at 06/12/2019 0500 Gross per 24 hour  Intake 1329.42 ml  Output 1901 ml  Net -571.58 ml   Mottling on abdominal wall and both thighs - resolved Brisk AT signals bilaterally Feet motor and sensory intact Midline and groin incisions c/d/i Abdomen soft  Laboratory Lab Results: Recent Labs    06/11/19 0559 06/12/19 0307  WBC 8.1 6.9  HGB 8.0* 8.6*  HCT 25.5* 25.9*  PLT 59* 73*   BMET Recent Labs    06/10/19 0021 06/12/19 0307  NA 140 139  K 4.0 3.1*  CL 107 106  CO2 24 26  GLUCOSE 125* 110*  BUN 23 13  CREATININE 1.63* 1.11  CALCIUM 7.2* 7.3*    COAG Lab Results  Component Value Date   INR 1.5 (H) 06/08/2019   INR 2.1 (H) 06/08/2019   INR 1.0 05/27/2019   No results found for: PTT  Assessment/Planning: POD #4 s/p open AAA repair with aorto-bifemoral bypass.  N: GCS 14, ongoing confusion, no other neuro deficits, moving legs well CV: No drips, Hgb 6.9 --> 8.0 --> 8.6 after transfusion Wednesday, no additional blood products, HDS at this time, metoprolol scheduled for afib, converted after one dose, no need for anticoagulation at this time per cardilogy, appreciate cardiology input GI: +BM, tolerated CLD advance to regular diet Renal: Cr improved to 1.11, adequate UOP, will d/c foley ID: Ancef for post-op prophylaxis FEN: IVF 50, replace K, acidosis improved Dispo: Floor today with sitter, held in ICU overnight after fall.  Overall looks good except for confusion.  Brisk AT signals bilaterally. Floor today with sitter.  Marty Heck 06/12/2019 9:12 AM --

## 2019-06-13 NOTE — Progress Notes (Addendum)
  AAA Progress Note    06/13/2019 8:50 AM 5 Days Post-Op  Subjective:  Wants to sit up  Afebrile HR 50's-80's NSR 741'O-878'M systolic 76% RA  Vitals:   06/12/19 2020 06/13/19 0402  BP: (!) 153/76 (!) 182/62  Pulse: 77 68  Resp: 16 14  Temp: 98.2 F (36.8 C) (!) 97.5 F (36.4 C)  SpO2: 100% 99%    Physical Exam: Cardiac:  regular Lungs:  Non labored Abdomen:  Soft, NT/ND Incisions:  Bilateral groin incisions are clean and dry as well as laparotomy incision. Extremities:  Monophasic DP & brisk peroneal doppler signals Neuro:  Alert to place, year and why he is here; not alert to president  CBC    Component Value Date/Time   WBC 6.9 06/12/2019 0307   RBC 2.92 (L) 06/12/2019 0307   HGB 8.6 (L) 06/12/2019 0307   HCT 25.9 (L) 06/12/2019 0307   PLT 73 (L) 06/12/2019 0307   MCV 88.7 06/12/2019 0307   MCH 29.5 06/12/2019 0307   MCHC 33.2 06/12/2019 0307   RDW 18.8 (H) 06/12/2019 0307   LYMPHSABS 0.5 (L) 06/10/2019 2257   MONOABS 0.4 06/10/2019 2257   EOSABS 0.0 06/10/2019 2257   BASOSABS 0.0 06/10/2019 2257    BMET    Component Value Date/Time   NA 139 06/12/2019 0307   K 3.1 (L) 06/12/2019 0307   CL 106 06/12/2019 0307   CO2 26 06/12/2019 0307   GLUCOSE 110 (H) 06/12/2019 0307   BUN 13 06/12/2019 0307   CREATININE 1.11 06/12/2019 0307   CALCIUM 7.3 (L) 06/12/2019 0307   GFRNONAA >60 06/12/2019 0307   GFRAA >60 06/12/2019 0307    INR    Component Value Date/Time   INR 1.5 (H) 06/08/2019 2049     Intake/Output Summary (Last 24 hours) at 06/13/2019 0850 Last data filed at 06/13/2019 0400 Gross per 24 hour  Intake 1275 ml  Output 7 ml  Net 1268 ml     Assessment/Plan:  78 y.o. male is s/p  Aortobifemoral bypass grafting 5 Days Post-Op  -pt tolerating diet -some confusion last night-still with mild confusion this morning but alert to place and why he is here. -wants to get up - talked to RN about getting him up to the chair.   Discussed with pt  not to get up by himself bc we are worried about him falling.  Reiterated this several times.  A sitter is not available.  -pt is hemodynamically stable.   Leontine Locket, PA-C [facility] [phone] 06/13/2019 8:50 AM  I have examined the patient, reviewed and agree with above.  Comfortable.  Eating breakfast.  Much less confused.  Continue to mobilize  Curt Jews, MD 06/13/2019 9:27 AM

## 2019-06-14 NOTE — Progress Notes (Signed)
Patient ID: Ryan Jackson, male   DOB: 08/04/1941, 77 y.o.   MRN: [id number]  Progress Note    06/14/2019 8:41 AM 6 Days Post-Op  Subjective: Comfortable.  Alert.  Oriented to self and location.   Vitals:   06/14/19 0526 06/14/19 0837  BP: (!) 158/66 (!) 144/64  Pulse:    Resp: 19 17  Temp:    SpO2:     Physical Exam: Abdomen and groin incisions healing.  Feet well perfused  CBC    Component Value Date/Time   WBC 6.9 06/12/2019 0307   RBC 2.92 (L) 06/12/2019 0307   HGB 8.6 (L) 06/12/2019 0307   HCT 25.9 (L) 06/12/2019 0307   PLT 73 (L) 06/12/2019 0307   MCV 88.7 06/12/2019 0307   MCH 29.5 06/12/2019 0307   MCHC 33.2 06/12/2019 0307   RDW 18.8 (H) 06/12/2019 0307   LYMPHSABS 0.5 (L) 06/10/2019 2257   MONOABS 0.4 06/10/2019 2257   EOSABS 0.0 06/10/2019 2257   BASOSABS 0.0 06/10/2019 2257    BMET    Component Value Date/Time   NA 139 06/12/2019 0307   K 3.1 (L) 06/12/2019 0307   CL 106 06/12/2019 0307   CO2 26 06/12/2019 0307   GLUCOSE 110 (H) 06/12/2019 0307   BUN 13 06/12/2019 0307   CREATININE 1.11 06/12/2019 0307   CALCIUM 7.3 (L) 06/12/2019 0307   GFRNONAA >60 06/12/2019 0307   GFRAA >60 06/12/2019 0307    INR    Component Value Date/Time   INR 1.5 (H) 06/08/2019 2049     Intake/Output Summary (Last 24 hours) at 06/14/2019 0841 Last data filed at 06/14/2019 3013 Gross per 24 hour  Intake 480 ml  Output 650 ml  Net -170 ml     Assessment/Plan:  78 y.o. male doing well overall.  Postoperative delirium improving.  Updated wife by telephone.  We will check CBC and BMP in a.m.     Rosetta Posner, MD FACS [facility] [phone] 06/14/2019 8:41 AM

## 2019-06-14 NOTE — Progress Notes (Signed)
Ambulated down to the nurse's station and back with a walker. Tolerated well.

## 2019-06-15 LAB — CBC
HCT: 26.9 % — ABNORMAL LOW (ref 39.0–52.0)
Hemoglobin: 8.5 g/dL — ABNORMAL LOW (ref 13.0–17.0)
MCH: 29.5 pg (ref 26.0–34.0)
MCHC: 31.6 g/dL (ref 30.0–36.0)
MCV: 93.4 fL (ref 80.0–100.0)
Platelets: 120 10*3/uL — ABNORMAL LOW (ref 150–400)
RBC: 2.88 MIL/uL — ABNORMAL LOW (ref 4.22–5.81)
RDW: 19.2 % — ABNORMAL HIGH (ref 11.5–15.5)
WBC: 5.5 10*3/uL (ref 4.0–10.5)
nRBC: 0 % (ref 0.0–0.2)

## 2019-06-15 LAB — BASIC METABOLIC PANEL
Anion gap: 7 (ref 5–15)
BUN: 11 mg/dL (ref 8–23)
CO2: 27 mmol/L (ref 22–32)
Calcium: 7.4 mg/dL — ABNORMAL LOW (ref 8.9–10.3)
Chloride: 106 mmol/L (ref 98–111)
Creatinine, Ser: 1.1 mg/dL (ref 0.61–1.24)
GFR calc Af Amer: >60 mL/min (ref >60)
GFR calc non Af Amer: >60 mL/min (ref >60)
Glucose, Bld: 104 mg/dL — ABNORMAL HIGH (ref 70–99)
Potassium: 3 mmol/L — ABNORMAL LOW (ref 3.5–5.1)
Sodium: 140 mmol/L (ref 135–145)

## 2019-06-15 MED ORDER — ASPIRIN EC 81 MG PO TBEC
81.0000 mg | DELAYED_RELEASE_TABLET | Freq: Every day | ORAL | Status: DC
  Administered 2019-06-15 – 2019-06-16 (×2): 81 mg via ORAL
  Filled 2019-06-15 (×2): qty 1

## 2019-06-15 MED ORDER — METOPROLOL TARTRATE 25 MG PO TABS
25.0000 mg | ORAL_TABLET | Freq: Two times a day (BID) | ORAL | Status: DC
  Administered 2019-06-15 – 2019-06-16 (×3): 25 mg via ORAL
  Filled 2019-06-15 (×3): qty 1

## 2019-06-15 MED ORDER — POTASSIUM CHLORIDE ER 10 MEQ PO TBCR
40.0000 meq | EXTENDED_RELEASE_TABLET | Freq: Once | ORAL | Status: AC
  Administered 2019-06-15: 40 meq via ORAL
  Filled 2019-06-15: qty 4

## 2019-06-15 NOTE — Plan of Care (Signed)
Continue to monitor

## 2019-06-15 NOTE — Care Management Important Message (Signed)
Important Message  Patient Details  Name: Ryan Jackson MRN: [id number] Date of Birth: July 15, 1941   Medicare Important Message Given:  Yes     Shelda Altes 06/15/2019, 12:21 PM

## 2019-06-15 NOTE — Progress Notes (Signed)
Physical Therapy Treatment Patient Details Name: Ryan Jackson MRN: [id number] DOB: 1941/02/06 Today's Date: 06/15/2019    History of Present Illness Ryan Jackson is a 78 y.o. 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  He presents today to further discuss surgery with his wife after echocardiogram.  7/27 s/p lysis of abdominal adhesions, AAA repair with aortobifemoral bypass, R common femoral art endarterectomy.    PT Comments    Pt admitted with above diagnosis. Pt currently with functional limitations due to the deficits listed below (see PT Problem List). Pt was able to ambulate with RW with min guard to min assist due to poor overall safety awareness due to disorientation and confusion.  Pt relies on RW for balance as well.  Pt will need SNF.   Pt will benefit from skilled PT to increase their independence and safety with mobility to allow discharge to the venue listed below.     Follow Up Recommendations  SNF;Supervision/Assistance - 24 hour     Equipment Recommendations  None recommended by PT;Other (comment)(TBA)    Recommendations for Other Services       Precautions / Restrictions Precautions Precautions: Fall Restrictions Weight Bearing Restrictions: Yes    Mobility  Bed Mobility Overal bed mobility: Needs Assistance Bed Mobility: Rolling;Sidelying to Sit;Sit to Sidelying Rolling: Min guard Sidelying to sit: Min guard     Sit to sidelying: Min guard    Transfers Overall transfer level: Needs assistance Equipment used: Rolling walker (2 wheeled) Transfers: Sit to/from Stand Sit to Stand: Min guard         General transfer comment: min guard for safety due to pt orientation and unsteady on feet.   Ambulation/Gait Ambulation/Gait assistance: Min assist;Min guard Gait Distance  (Feet): 250 Feet Assistive device: Rolling walker (2 wheeled) Gait Pattern/deviations: Step-through pattern;Decreased stride length;Drifts right/left   Gait velocity interpretation: <1.31 ft/sec, indicative of household ambulator General Gait Details: Pt ambulating well with RW needing cues at times for walker safety.  Pt disoriented thinking he was at home and needs frequent re-orientation.    Stairs             Wheelchair Mobility    Modified Rankin (Stroke Patients Only)       Balance Overall balance assessment: Needs assistance Sitting-balance support: No upper extremity supported;Feet supported Sitting balance-Leahy Scale: Good     Standing balance support: No upper extremity supported;During functional activity Standing balance-Leahy Scale: Poor Standing balance comment: pt needed AD or steadying assist                            Cognition Arousal/Alertness: Awake/alert Behavior During Therapy: Impulsive Overall Cognitive Status: Impaired/Different from baseline Area of Impairment: Orientation;Attention;Memory;Following commands;Safety/judgement;Problem solving;Awareness                 Orientation Level: Disoriented to;Situation;Time;Place Current Attention Level: Selective Memory: Decreased short-term memory Following Commands: Follows one step commands with increased time Safety/Judgement: Decreased awareness of safety;Decreased awareness of deficits Awareness: Intellectual Problem Solving: Slow processing;Decreased initiation;Difficulty sequencing;Requires verbal cues;Requires tactile cues General Comments: pt oriented to name (not birthday, place, situation or time), inconsistently following commands, but noted pt with limited participation during sesson      Exercises      General Comments  Pertinent Vitals/Pain Pain Assessment: No/denies pain    Home Living                      Prior Function             PT Goals (current goals can now be found in the care plan section) Acute Rehab PT Goals Patient Stated Goal: to go home Progress towards PT goals: Progressing toward goals    Frequency    Min 3X/week      PT Plan Current plan remains appropriate    Co-evaluation              AM-PAC PT "6 Clicks" Mobility   Outcome Measure  Help needed turning from your back to your side while in a flat bed without using bedrails?: A Little Help needed moving from lying on your back to sitting on the side of a flat bed without using bedrails?: A Little Help needed moving to and from a bed to a chair (including a wheelchair)?: A Little Help needed standing up from a chair using your arms (e.g., wheelchair or bedside chair)?: A Little Help needed to walk in hospital room?: A Little Help needed climbing 3-5 steps with a railing? : Total 6 Click Score: 16    End of Session Equipment Utilized During Treatment: Gait belt Activity Tolerance: Patient limited by fatigue Patient left: with call bell/phone within reach;in bed;with bed alarm set Nurse Communication: Mobility status PT Visit Diagnosis: Difficulty in walking, not elsewhere classified (R26.2);Other symptoms and signs involving the nervous system (R29.898);Other abnormalities of gait and mobility (R26.89)     Time: 5997-7414 PT Time Calculation (min) (ACUTE ONLY): 18 min  Charges:  $Gait Training: 8-22 mins                     [place] Pager:  [phone]  Office:  [place] 06/15/2019, 4:11 PM

## 2019-06-15 NOTE — Progress Notes (Addendum)
  Progress Note    06/15/2019 7:32 AM 7 Days Post-Op  Subjective:  Awake; no complaints.  Wonders when the doctor is [place] Sp[place] let him go home.   Afebrile HR 50's-90's 161'W-960'A systolic 54% RA  Vitals:   06/14/19 2037 06/15/19 0448  BP: (!) 151/65 (!) 132/59  Pulse: 73 (!) 58  Resp: 15 13  Temp: 98.2 F (36.8 C) 98 F (36.7 C)  SpO2: 97% 96%    Physical Exam: Cardiac:  regular Lungs:  Non labored Incisions:  Laparotomy and bilateral groin incisions look good Extremities:  Brisk AT doppler signals bilateral feet  Abdomen:  Soft, NT/ND; +BS Neuro:  Alert; oriented to self, place and why he is here.  Not oriented for year or president.  CBC    Component Value Date/Time   WBC 6.9 06/12/2019 0307   RBC 2.92 (L) 06/12/2019 0307   HGB 8.6 (L) 06/12/2019 0307   HCT 25.9 (L) 06/12/2019 0307   PLT 73 (L) 06/12/2019 0307   MCV 88.7 06/12/2019 0307   MCH 29.5 06/12/2019 0307   MCHC Ryan.2 06/12/2019 0307   RDW 18.8 (H) 06/12/2019 0307   LYMPHSABS 0.5 (L) 06/10/2019 2257   MONOABS 0.4 06/10/2019 2257   EOSABS 0.0 06/10/2019 2257   BASOSABS 0.0 06/10/2019 2257    BMET    Component Value Date/Time   NA 139 06/12/2019 0307   K 3.1 (L) 06/12/2019 0307   CL 106 06/12/2019 0307   CO2 26 06/12/2019 0307   GLUCOSE 110 (H) 06/12/2019 0307   BUN 13 06/12/2019 0307   CREATININE 1.11 06/12/2019 0307   CALCIUM 7.3 (L) 06/12/2019 0307   GFRNONAA >60 06/12/2019 0307   GFRAA >60 06/12/2019 0307    INR    Component Value Date/Time   INR 1.5 (H) 06/08/2019 2049     Intake/Output Summary (Last 24 hours) at 06/15/2019 0732 Last data filed at 06/14/2019 1723 Gross per 24 hour  Intake -  Output 400 ml  Net -400 ml     Assessment:  78 y.o. Jackson is s/p:  Aortobifemoral bypass grafting  7 Days Post-Op  Plan: -pt with brisk doppler signals bilateral feet -pt is hemodynamically stable -tolerating diet -pt still with mild confusion-oriented to self and place and why he is  here but not year or president. -pt ambulated with walked to nursing station yesterday afternoon.  PT recommending SNF, but hoping they can re-evaluate today as he is more alert and mobile.  -DVT prophylaxis:  Sq heparin to start today -dc IVF -open blinds to orient pt to daytime.    Leontine Locket, PA-C [facility] [phone] 06/15/2019 7:32 AM  I have seen and evaluated the patient. I agree with the PA note as documented above. Overall looks good - confusion improving and oriented to person, place, and reason today.  Incisions looks great.  Brisk AT signals.  Tolerating PO.  Will transition to PO metoprolol and d/c IV metoprolol.  PT recommending SNF.  OK for discharge when has placement.  Marty Heck, MD [facility] [place] Office: [phone] Pager: [phone]

## 2019-06-15 NOTE — TOC Progression Note (Addendum)
Transition of Care [facility]) - Progression Note    Patient Details  Name: Ryan Jackson MRN: [id number] Date of Birth: [birth year]  Transition of Care [facility]) CM/SW [place], LCSW Phone Number: 06/15/2019, 2:40 PM  Clinical Narrative:    CSW went over home health agencies with patient's spouse. She reports acceptance for [facility] since they have received a pre-op referral and spoke with the patient last week. Cassie with [facility] aware.   Unable to have walker delivered due to [place] being primary. Patient will have to go through the [place] for equipment.    Expected Discharge Plan: [place] Barriers to Discharge: Continued Medical Work up  Expected Discharge Plan and Services Expected Discharge Plan: [place] In-house Referral: Clinical Social Work Discharge Planning Services: NA Post Acute Care Choice: [place] arrangements for the past 2 months: Single Family Home                 DME Arranged: Walker rolling DME Agency: [facility]       HH Arranged: RN, PT, OT HH Agency: [facility] [place] Date HH Agency Contacted: 06/15/19 Time [place]: 1329 Representative spoke with at [place]: Cassie   Social Determinants of Health ([place]) Interventions    Readmission Risk Interventions No flowsheet data found.

## 2019-06-15 NOTE — TOC Initial Note (Addendum)
Transition of Care [facility]) - Initial/Assessment Note    Patient Details  Name: Ryan Jackson MRN: [id number] Date of Birth: [birth year]  [facility]) CM/SW Contact:    Benard [place], LCSW Phone Number: 06/15/2019, 1:30 PM  Clinical Narrative:                 CSW spoke with patient's spouse regarding discharge plan. She reports understanding of PT's recommendation of SNF but she believes patient will do better at home in familiar surroundings. She requests home health services. Per patient's [facility] social worker, Erline Levine [phone] ex [phone] (in for Kellie Simmering) patient is able to use his Medicare for home health services (PT/OT/RN) for a shorter waiting period. Patient's PCP at the [place] is Dr. Lorelee Cover. Patient's address is correct. Patient's wife is accepting of plan. CSW offered choice. Patient does not have equipment at home; CSW will have a rolling walker delivered to the room prior to discharge. Patient's wife requesting notice on when to pick patient up for DC so her family can drive her.   Expected Discharge Plan: [place] Barriers to Discharge: Continued Medical Work up   Patient Goals and CMS Choice Patient states their goals for this hospitalization and ongoing recovery are:: Return home CMS Medicare.gov Compare Post Acute Care list provided to:: Patient Represenative (must comment)(Spouse) Choice offered to / list presented to : Spouse  Expected Discharge Plan and Services Expected Discharge Plan: [place] In-house Referral: Clinical Social Work Discharge Planning Services: NA Post Acute Care Choice: [place] arrangements for the past 2 months: Single Family Home                 DME Arranged: Walker rolling DME Agency: [facility]       HH Arranged: RN, PT, OT HH Agency: [place] Date [facility] Agency Contacted: 06/15/19 Time [place]: 1329 Representative spoke with at [place]: Georgina Snell  Prior Living Arrangements/Services Living arrangements for the past 2 months: [place] with:: Spouse Patient language and need for interpreter reviewed:: Yes Do you feel safe going back to the place where you live?: Yes      Need for Family Participation in Patient Care: Yes (Comment) Care giver support system in place?: Yes (comment)   Criminal Activity/Legal Involvement Pertinent to Current Situation/Hospitalization: No - Comment as needed  Activities of Daily Living Home Assistive Devices/Equipment: None ADL Screening (condition at time of admission) Patient's cognitive ability adequate to safely complete daily activities?: No Is the patient deaf or have difficulty hearing?: No Does the patient have difficulty seeing, even when wearing glasses/contacts?: Yes Does the patient have difficulty concentrating, remembering, or making decisions?: Yes Patient able to express need for assistance with ADLs?: Yes Does the patient have difficulty dressing or bathing?: Yes Independently performs ADLs?: No Communication: Independent Grooming: Needs assistance Is this a change from baseline?: Change from baseline, expected to last <3 days Feeding: Needs assistance Is this a change from baseline?: Change from baseline, expected to last <3 days Does the patient have difficulty walking or climbing stairs?: Yes Weakness of Legs: Both Weakness of Arms/Hands: Both  Permission Sought/Granted Permission sought to share information with : Facility Sport and exercise psychologist, Family Supports Permission granted to share information with : Yes, Verbal Permission Granted  Share Information with NAME: [name]  Permission granted to share info w AGENCY: [place] granted to share info w Relationship: Spouse  Permission granted to share info w Contact Information: [phone]  Emotional Assessment Appearance:: Appears stated age Attitude/Demeanor/Rapport: Unable  to Assess Affect (typically observed): Unable to Assess Orientation: : Oriented to Self, Oriented to Situation Alcohol / Substance Use: Not Applicable Psych Involvement: No (comment)  Admission diagnosis:  AAA (abdominal aortic aneurysm) [facility]) [I71.4] Patient Active Problem List   Diagnosis Date Noted  . AAA (abdominal aortic aneurysm) ([place]) 06/08/2019  . Aortic dissection ([place]) 04/14/2019  . Abdominal aortic aneurysm ([place]) 04/14/2019  . Aneurysm of left common iliac artery ([place]) 04/14/2019  . Aneurysm of right common iliac artery ([place]) 04/14/2019  . Atherosclerosis of native arteries of extremity with intermittent claudication ([place]) 04/14/2019   PCP:  [place]:   [place], [place] [phone] [place] [address] Phone: [phone] Fax: [phone]     Social Determinants of Health (SDOH) Interventions    Readmission Risk Interventions No flowsheet data found.

## 2019-06-15 NOTE — Progress Notes (Signed)
Tele sitter into patient's room at this time. [place] notified.

## 2019-06-16 MED ORDER — OXYCODONE-ACETAMINOPHEN 5-325 MG PO TABS: 1.0000 | ORAL_TABLET | Freq: Four times a day (QID) | ORAL | 0 refills | Status: AC | PRN

## 2019-06-16 MED ORDER — METOPROLOL TARTRATE 25 MG PO TABS: 25.0000 mg | ORAL_TABLET | Freq: Two times a day (BID) | ORAL | 2 refills | Status: AC

## 2019-06-16 MED FILL — OXYCODONE W/APAP 5/325 TAB: 5-325 | 5 days supply | Qty: 20 | Fill #0

## 2019-06-16 MED FILL — METOPROLOL TARTRATE 25 MG T: 25 | 30 days supply | Qty: 60 | Fill #0

## 2019-06-16 NOTE — Plan of Care (Signed)
Patient discharging home  

## 2019-06-16 NOTE — Plan of Care (Signed)
Continue to monitor

## 2019-06-16 NOTE — Progress Notes (Addendum)
  Progress Note    06/16/2019 7:14 AM 8 Days Post-Op  Subjective:  No complaints; denies nausea  Afebrile HR 50's-80's NSR 242'A-834'H systolic 96% RA  Vitals:   06/15/19 2020 06/16/19 0613  BP: (!) 148/77 (!) 151/67  Pulse: 73 68  Resp: 15 15  Temp: 97.8 F (36.6 C) 98.3 F (36.8 C)  SpO2: 100% 98%    Physical Exam: Cardiac:  regular Lungs:  Non labored Incisions:  Midline and bilateral groins are clean and dry; healing nicely Extremities:  Brisk doppler flow bilateral AT  Abdomen:  Soft, NT/ND; -N/V Neuro:  Intact; alert to why he is here and president.  Thought he was at church but quickly oriented to place.   CBC    Component Value Date/Time   WBC 5.5 06/15/2019 0819   RBC 2.88 (L) 06/15/2019 0819   HGB 8.5 (L) 06/15/2019 0819   HCT 26.9 (L) 06/15/2019 0819   PLT 120 (L) 06/15/2019 0819   MCV 93.4 06/15/2019 0819   MCH 29.5 06/15/2019 0819   MCHC 31.6 06/15/2019 0819   RDW 19.2 (H) 06/15/2019 0819   LYMPHSABS 0.5 (L) 06/10/2019 2257   MONOABS 0.4 06/10/2019 2257   EOSABS 0.0 06/10/2019 2257   BASOSABS 0.0 06/10/2019 2257    BMET    Component Value Date/Time   NA 140 06/15/2019 0819   K 3.0 (L) 06/15/2019 0819   CL 106 06/15/2019 0819   CO2 27 06/15/2019 0819   GLUCOSE 104 (H) 06/15/2019 0819   BUN 11 06/15/2019 0819   CREATININE 1.10 06/15/2019 0819   CALCIUM 7.4 (L) 06/15/2019 0819   GFRNONAA >60 06/15/2019 0819   GFRAA >60 06/15/2019 0819    INR    Component Value Date/Time   INR 1.5 (H) 06/08/2019 2049     Intake/Output Summary (Last 24 hours) at 06/16/2019 0714 Last data filed at 06/15/2019 1554 Gross per 24 hour  Intake 0 ml  Output -  Net 0 ml     Assessment:  78 y.o. male is s/p:  Aortobifemoral bypass grafting  8 Days Post-Op  Plan: -brisk doppler signals bilateral feet  -abdomen is soft and tolerating diet -PT recommending SNF, however, night RN and day RN state pt is doing quite well up walking without walker and is  steady.  Still with mild confusion, but feel this will improve in comfortable surroundings at home.  [place] services have been arranged. -home today and f/u with Dr. Carlis Abbott in 2-3 weeks.  His wife and son will be here around noon to pick him up.  Discussed with RN.   Leontine Locket, PA-C [facility] [phone] 06/16/2019 7:14 AM  I have seen and evaluated the patient. I agree with the PA note as documented above. No complaints.  Doing well s/p aortobifem.  Incisions look good.  Brisk AT signals bilaterally.  Tolerating PO.  PT recommended SNF, but wife wanted to take patient home.  He is now ambulating much better with rolling walker.  Plan d/c home today.  Script for 25 mg metoprolol BID sent to pharmacy.  Wound check in 2-3 weeks.  Mental status continues to improve A&O x2 today.  Marty Heck, MD [facility] [place] Office: [phone] Pager: [phone]

## 2019-06-16 NOTE — TOC Transition Note (Signed)
Transition of Care [facility]) - CM/SW Discharge Note Marvetta Gibbons RN, BSN Transitions of Care Unit 4E- RN Case Manager [phone]   Patient Details  Name: Ryan Jackson MRN: [id number] Date of Birth: Mar 15, 1941  Transition of Care [facility]) CM/SW Contact:  Dawayne Patricia, RN Phone Number: 06/16/2019, 10:17 AM   Clinical Narrative:    Pt stable for transition home today, HH has been arranged as per pre-op referral to [facility]- pt/wife agreeable. Tiffany with [facility] notified of transition home today. Orders have been placed for HHRN/PT/OT. Pt also needs RW- DME order placed- Zach with [employer] contacted for DME need- RW to be delivered to room prior to discharge.    Final next level of care: [place] Barriers to Discharge: Barriers Resolved, No Barriers Identified   Patient Goals and CMS Choice Patient states their goals for this hospitalization and ongoing recovery are:: Return home CMS Medicare.gov Compare Post Acute Care list provided to:: Patient Represenative (must comment)(Spouse) Choice offered to / list presented to : Spouse  Discharge Placement  Home with [facility]                     Discharge Plan and Services In-house Referral: Clinical Social Work Discharge Planning Services: NA Post Acute Care Choice: Home Health          DME Arranged: Walker rolling DME Agency: [facility] Date DME Agency Contacted: 06/16/19 Time DME Agency Contacted: 42 Representative spoke with at DME Agency: zach HH Arranged: RN, PT, OT [place] Agency: [facility] Date [place]: 06/15/19 Time [place]: 1329 Representative spoke with at [place]: Cassie  Social Determinants of Health ([place]) Interventions     Readmission Risk Interventions Readmission Risk Prevention Plan 06/16/2019  Post Dischage Appt Complete  Medication Screening Complete  Transportation Screening Complete

## 2019-06-16 NOTE — Discharge Summary (Signed)
AAA Discharge Summary    Stoney Karczewski 1941/08/02 78 y.o. male  [id number]  Admission Date: 06/08/2019  Discharge Date: 06/16/2019  Physician: Marty Heck, MD  Admission Diagnosis: AAA (abdominal aortic aneurysm) [facility]) [I71.4]   HPI:   This is a 78 y.o. 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  As previously noted, patient has known about several aneurysms in his abdomen for years and has been followed at the [place]. He has been seen by Dr. Nicola Girt a vascular surgeon there with serial duplex imaging.  In review of records, in 2019 on duplex had ectatic infra-renal aorta at 2.7 cm, right common iliac was 2.3 cm, and left was 1.8 cm. There was growth on duplex this year and CTA was ordered.  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 In addition he endorses bilateral lower extremity claudication symptoms. He states he no longer smokes. States he never had a heart attack. States he is able to mow his grass with a riding mower and can walk from the office to the parking lot without stopped. States both of his legs hurt with claudication symptoms and he can walk about 100 feet before he has to stop. ABI's at [facility] 0.53 right, 0.44 left.  He does take Plavix.   No abdominal or back painon todays visit. Aneurysm is otherwise asymptomatic.   Previous abdominal surgery includes bilateral oblique abdominal incisions and he states one of these wasfor  suspected appendicitis and then they had to make an incision on the other side when learned he had perforated bowel.  Hospital Course:  The patient was admitted to the hospital and taken to the operating room on 06/08/2019 and underwent: 1.  Extensive lysis of abdominal adhesions 2.  Open abdominal aortic aneurysm repair with aorto bifemoral bypass (18 mm x 9 mm bifurcated Dacron graft) 3.  Right common femoral artery endarterectomy    Findings: Findings: A 18 mm x 9 mm bifurcated Dacron graft was sewn to the infrarenal aorta below the left renal artery end-to-end.  The chronic infra-renal dissection was fenestrated.  The IMA was ligated since there was no back bleeding.  The limbs were tunneled under each ureter and then sewn to each common femoral artery in each groin in end to side fashion.  The right common iliac aneurysm was opened down to the bifurcation of the right external iliac and hypogastric artery where this was then oversewn just proximal to the bifurcation.  The right hypogastric artery should be perfused via retrograde perfusion of the right limb of the graft up the right external iliac artery.  I did confirm excellent doppler signal in the right hypogastric at the end of the case.  On the left the common as well as the hypogastric aneurysm were opened and several branches down the pelvis were ligated to prevent backbleeding and growth of the aneurysm so that it was completely excluded.  The left external iliac was then ligated at the takeoff.  It should be noted that the left  hypogastric artery did appear occluded in the aneurysm with antegrade flow and only 1 or 2 small branches in the pelvis had some retrograde perfusion - these were ligated during the case.  The right common femoral artery required extensive endarterectomy.  There were anterior tibial artery signals at the end of the case in each leg.  The pt tolerated the procedure well and was transported to the PACU in good  condition.   By POD 1, he was doing well and overall looks better today after warming and resuscitation in ICU.  Brisk AT signals now and feet motor and sensory intact.  Will recheck labs this am to monitor Hgb and acidosis - profound hypotension in OR yesterday with acidosis - seems to be clinically improving.  POD 2, doing well overall, but has some confusion.  He continues to have brisk AT signals bilaterally.   He did have some afib and cardiology was consulted.   1.  Paroxysmal atrial fib:  Had a brief episode of  PAF today - 1 day following iliac aneurism repair.   Is still confused following anesthesia.  He has no angina.    Is able to converse without any trouble. At this point, I do not think he necessarily needs to be started on anticoagulation.   This episode was very brief and occurred the day following vascular surgery.    Echo from June shows normal LV systolic function.   Continue IV metoprolol PRN.   Will start metoprolol 25 PO BID once he is taking food.  Will follow along. If he has additional episodes of AF, will need to consider starting anticoagulation.  POD 3, pt was started on clear liquid diet.  He was transferred to the telemetry floor.  He did receive PRBC's for acute surgical blood loss anemia and responded well.   MD was Called by nursing in [place] that Mr. Lapinsky fell out of bed this afternoon and was found facedown.  This was an unwitnessed fall.  He was put back in bed with no obvious trauma.  Remained hemodynamically stable.  Sitter is now at bedside.  He has had ongoing confusion since his open abdominal aneurysm repair earlier this week.  I went to examine him myself and his midline incision as well as both groin incisions are clean and dry.  He has brisk AT signals in both feet.  He has no obvious neurologic deficit.  He is actually the most oriented that I have seen him he can tell me he is in the hospital after aneurysm surgery and his name is Kevan Rosebush.   Discussed holding on transfer for now so that we can monitor him after his fall and will continue to have a sitter at bedside.  POD 4, per cardiology, it was felt they would not anticoagulate him based on what we have seen so far.   Had a brief run of A Fib following vascular surgery and has not had any recurrence.   In addition, he is still confused and fell yesterday.  He remained confused but overall looks good.  He was advanced to a regular diet.  Continued to have brisk AT doppler signals.   The remainder of his hospitalization, he was less confused but did still have some mild confusion.  He was transitioned to po beta blocker and incisions healing nicely.  His metal status continued to improve.  Per RN, he was walking well in the halls with a rolling walker.  [facility] services are arranged.  Wife wants pt to come home despite PT recs for SNF.   The remainder of the hospital course consisted of increasing mobilization and increasing intake of solids without difficulty.  CBC    Component Value Date/Time   WBC 5.5 06/15/2019 0819   RBC 2.88 (L) 06/15/2019 0819   HGB 8.5 (L) 06/15/2019 0819   HCT 26.9 (L) 06/15/2019 0819   PLT 120 (L) 06/15/2019 0819   MCV 93.4 06/15/2019 0819   MCH 29.5 06/15/2019 0819   MCHC 31.6 06/15/2019 0819   RDW 19.2 (H) 06/15/2019 0819   LYMPHSABS 0.5 (L) 06/10/2019 2257   MONOABS 0.4 06/10/2019 2257   EOSABS 0.0 06/10/2019 2257   BASOSABS 0.0 06/10/2019 2257    BMET    Component Value Date/Time   NA 140 06/15/2019 0819   K 3.0 (L) 06/15/2019 0819   CL 106 06/15/2019 0819   CO2 27 06/15/2019 0819   GLUCOSE 104 (H) 06/15/2019 0819   BUN 11 06/15/2019 0819   CREATININE 1.10 06/15/2019 0819   CALCIUM 7.4 (L) 06/15/2019 0819   GFRNONAA >60 06/15/2019 0819   GFRAA >60 06/15/2019 0819     Discharge Instructions    Discharge patient   Complete by: As directed    Discharge home today.  Please verify all HH needs are arranged prior to dc.  Thanks    Discharge disposition: 01-Home or Self Care   Discharge patient date: 06/16/2019      Discharge Diagnosis:  AAA (abdominal aortic aneurysm) [facility]) [I71.4]  Secondary Diagnosis: Patient Active Problem List   Diagnosis Date Noted   AAA (abdominal aortic aneurysm) ([place]) 06/08/2019   Aortic dissection ([place]) 04/14/2019   Abdominal aortic aneurysm ([place]) 04/14/2019   Aneurysm of left common iliac artery ([place]) 04/14/2019   Aneurysm of right common iliac artery ([place]) 04/14/2019   Atherosclerosis of native arteries of extremity with intermittent claudication ([place]) 04/14/2019   Past Medical History:  Diagnosis Date   AAA (abdominal aortic aneurysm) (HCC)    3.0 cm with focal dissection at proximal infrarenal segment with moderate stenosis 02/2019   Cancer ([place])    SKIN CNACER ON BACK   Hypertension    Iliac artery aneurysm (HCC)    PAD (peripheral artery disease) (HCC)      Allergies as of 06/16/2019      Reactions   Mevacor [lovastatin]    Unknown reaction   Zocor [simvastatin]    Unknown reaction      Medication List    STOP taking these medications   lisinopril 20 MG tablet Commonly known as: ZESTRIL   naproxen sodium 220 MG tablet Commonly known as: ALEVE     TAKE these medications   aspirin EC 81 MG tablet Take 81 mg by mouth daily.   carboxymethylcellulose 0.5 % Soln Commonly known as: REFRESH PLUS Place 1 drop into both eyes 3 (three) times daily as needed (dry eyes).   clopidogrel 75 MG tablet Commonly known as: PLAVIX Take 75 mg by mouth daily.   diphenhydramine-acetaminophen 25-500 MG Tabs tablet Commonly known as: TYLENOL PM Take 1 tablet by mouth at bedtime.   ferrous sulfate 325 (65 FE) MG tablet Take 325 mg by mouth daily with breakfast.   hydrocortisone 2.5 % cream Apply 1 application topically daily as needed (itching).   metoprolol tartrate 25 MG tablet Commonly known as: LOPRESSOR Take 1 tablet (25 mg total) by mouth 2 (two) times  daily.   omeprazole 40 MG capsule Commonly known as: PRILOSEC Take  40 mg by mouth daily.   oxyCODONE-acetaminophen 5-325 MG tablet Commonly known as: Percocet Take 1 tablet by mouth every 6 (six) hours as needed for severe pain.            Durable Medical Equipment  (From admission, onward)         Start     Ordered   06/15/19 1433  For home use only DME Walker rolling  Once    Comments: Post op  Question:  Patient needs a walker to treat with the following condition  Answer:  Weakness   06/15/19 1432          Instructions:  [facility] Discharge Instructions   Open Aortic Surgery  Please refer to the following instructions for your post-procedure care. Your surgeon or Physician Assistant will discuss any changes with you.  Activity  Avoid lifting more than eight pounds (a gallon of milk) until after your first post-operative visit. You are encouraged to walk as much as you can. You can slowly return to normal activities but must avoid strenuous activity and heavy lifting until your doctor tells you it's okay. Heavy lifting can hurt the incision and cause a hernia. Avoid activities such as vacuuming or swinging a golf club. It is normal to feel tired for several weeks after your surgery. Do not drive until your doctor gives the okay and you are no longer taking prescription pain medications. It is also normal to have difficulty with sleep habits, eating and bowl movements after surgery. These will go away with time.  Bathing/Showering  Shower daily after you go home. Do not soak in a bathtub, hot tub, or swim until the incision heals.  Incision Care  Shower every day. Clean your incision with mild soap and water. Pat the area dry with a clean towel. You do not need a bandage unless otherwise instructed. Do not apply any ointments or creams to your incision. You may have skin glue on your incision. Do not peel it off. It will come off on  its own in about one week. If you have staples or sutures along your incision, they will be removed at your post op appointment.  If you have groin incisions, wash the groin wounds with soap and water daily and pat dry. (No tub bath-only shower)  Then put a dry gauze or washcloth in the groin to keep this area dry to help prevent wound infection.  Do this daily and as needed.  Do not use Vaseline or neosporin on your incisions.  Only use soap and water on your incisions and then protect and keep dry.  Diet  Resume your normal diet. There are no special food restriction following this procedure. A low fat/low cholesterol diet is recommended for all patients with vascular disease. After your aortic surgery, it's normal to feel full faster than usual and to not feel as hungry as you normally would. You will probably lose weight initially following your surgery. It's best to eat small, frequent meals over the course of the day. Call the office if you find that you are unable to eat even small meals.   In order to heal from your surgery, it is CRITICAL to get adequate nutrition. Your body requires vitamins, minerals, and protein. Vegetables are the best source of vitamins and minerals.  If you have pain, you may take over-the-counter pain reliever such as acetaminophen (Tylenol). If you were prescribed a stronger pain medication, please be aware  these medication can cause nausea and constipation. Prevent nausea by taking the medication with a snack or meal. Avoid constipation by drinking plenty of fluids and eating foods with a high amount of fiber, such as fruits, vegetables and grains. Take 100mg  of the over-the-counter stool softener Colace twice a day as needed to help with constipation. A laxative, such as Milk of Magnesia, may be recommended for you at this time. Do not take a laxative unless your surgeon or P.A. tells you it's OK.  Do not take Tylenol if you are taking stronger pain medications (such as  Percocet).  Follow Up  Our office will schedule a follow up appointment 2-3 weeks after discharge.  Please call us immediately for any of the following conditions    .     Severe or worsening pain in your legs or feet or in your abdomen back or chest.  Increased pain, redness drainage (pus) from your incision site.  Increased abdominal pain, bloating, nausea, vomiting, or persistent diarrhea.  Fever of 101 degrees or higher.  Swelling in your leg (s).   Reduce your risk of vascular disease   Stop smoking. If you would like help, call [facility] at 1-800-QUIT-NOW 343-453-4718) or [place] at [phone].  Manage your cholesterol  Maintain a desired weight  Control your diabetes  Keep your blood pressure down   If you have any questions please call the office at [phone].   Prescriptions given: 1.  Roxicet #20 No Refill 2.  Metoprolol 25mg  bid #60 two refills (refills Per PCP/VA)  Disposition: [facility] with [facility]  Patient's condition: is Good  Follow up: 1. Dr. Carlis Abbott in 2 weeks   Leontine Locket, PA-C [facility] [phone] 06/16/2019  7:32 AM   - For VQI Registry use -  Post-op:  Time to Extubation: [x]  In OR, [ ]  < 12 hrs, [ ]  12-24 hrs, [ ]  >=24 hrs Vasopressors Req. Post-op: No ICU Stay: 4 day in ICU Transfusion: Yes   If yes, 3 units PRBC's, 2 FFP, 1 platelet phoresis pack units given MI: No, [ ]  Troponin only, [ ]  EKG or Clinical New Arrhythmia: No  Complications: CHF: No Resp failure: No, [ ]  Pneumonia, [ ]  Ventilator Chg in renal function: No, [ ]  Inc. Cr > 0.5, [ ]  Temp. Dialysis, [ ]  Permanent dialysis Leg ischemia: No, no Surgery needed, [ ]  Yes, Surgery needed, [ ]  Amputation Bowel ischemia: No, [ ]  Medical Rx, [ ]  Surgical Rx Wound complication: No, [ ]  Superficial separation/infection, [ ]  Return to OR Return to OR: No  Return to OR for bleeding: No Stroke: No, [ ]  Minor, [ ]  Major  Discharge  medications: Statin use:  No -allergies ASA use:  Yes   Plavix use:  Yes  Beta blocker use:  Yes  ACEI use:  No ARB use:  No CCB use:  No Coumadin use:  No

## 2019-06-16 NOTE — Progress Notes (Signed)
Discharge instructions reviewed with patient and patient's wife at this time. All questions answered. Prescriptions sent with patient that was filled at [place].   Emelda Fear, RN

## 2019-07-07 ENCOUNTER — Encounter: Payer: Self-pay | Admitting: Vascular Surgery

## 2019-07-07 ENCOUNTER — Other Ambulatory Visit: Payer: Self-pay

## 2019-07-07 ENCOUNTER — Ambulatory Visit (INDEPENDENT_AMBULATORY_CARE_PROVIDER_SITE_OTHER): Payer: Self-pay | Admitting: Vascular Surgery

## 2019-07-07 VITALS — BP 175/83 | HR 50 | Temp 97.1°F | Resp 16 | Wt 150.0 lb

## 2019-07-07 DIAGNOSIS — I714 Abdominal aortic aneurysm, without rupture, unspecified: Secondary | ICD-10-CM

## 2019-07-07 DIAGNOSIS — I723 Aneurysm of iliac artery: Secondary | ICD-10-CM

## 2019-07-07 DIAGNOSIS — I7102 Dissection of abdominal aorta: Secondary | ICD-10-CM

## 2019-07-07 NOTE — Progress Notes (Signed)
Patient name: Ryan Jackson MRN: [id number] DOB: [birth year] Sex: male  REASON FOR VISIT: Postop after aortobifemoral bypass  HPI: Ryan Jackson is a 78 y.o. male that presents for postop check after aortobifemoral bypass and right femoral endarterectomy on 06/08/2019.  Patient ultimately had a 3 cm infrarenal abdominal aortic aneurysm with chronic infrarenal dissection as well as a 2.3 cm right common iliac aneurysm and 3.5 cm left common iliac aneurysm including hypogastric.  He is doing remarkably well today.  Still has a little bit of confusion that has been ongoing since surgery particularly in the morning.  He did have occlusive disease as well and was complaining of claudication prior to surgery but that since improved.  Had bilateral flush SFA occlusions with iliac occlusive disease distal to aneurysm.  Incisions are healing without issue.  No fevers or chills.  No drainage in his groins.  Feet feel good.  Past Medical History:  Diagnosis Date   AAA (abdominal aortic aneurysm) (HCC)    3.0 cm with focal dissection at proximal infrarenal segment with moderate stenosis 02/2019   Cancer ([place])    SKIN CNACER ON BACK   Hypertension    Iliac artery aneurysm (HCC)    PAD (peripheral artery disease) (HCC)     Past Surgical History:  Procedure Laterality Date   ABDOMINAL AORTIC ANEURYSM REPAIR N/A 06/08/2019   Procedure: OPEN ANEURYSM ABDOMINAL AORTIC REPAIR with Aorta Bifemoral Bypass Graft using Hemashield Gold Graft;  Surgeon: Marty Heck, MD;  Location: [facility] OR;  Service: Vascular;  Laterality: N/A;   LYSIS OF ADHESION N/A 06/08/2019   Procedure: Extensive Lysis Of Adhesion;  Surgeon: Marty Heck, MD;  Location: [place];  Service: Vascular;  Laterality: N/A;   SMALL INTESTINE SURGERY      Family History  Family history unknown: Yes    SOCIAL HISTORY: Social History   Tobacco Use   Smoking status: Former Smoker    Types: Cigarettes    Quit date: 2010     Years since quitting: 10.6   Smokeless tobacco: Never Used  Substance Use Topics   Alcohol use: Not Currently    Allergies  Allergen Reactions   Mevacor [Lovastatin]     Unknown reaction   Zocor [Simvastatin]     Unknown reaction    Current Outpatient Medications  Medication Sig Dispense Refill   aspirin EC 81 MG tablet Take 81 mg by mouth daily.     carboxymethylcellulose (REFRESH PLUS) 0.5 % SOLN Place 1 drop into both eyes 3 (three) times daily as needed (dry eyes).     clopidogrel (PLAVIX) 75 MG tablet Take 75 mg by mouth daily.     diphenhydramine-acetaminophen (TYLENOL PM) 25-500 MG TABS tablet Take 1 tablet by mouth at bedtime.     ferrous sulfate 325 (65 FE) MG tablet Take 325 mg by mouth daily with breakfast.     hydrocortisone 2.5 % cream Apply 1 application topically daily as needed (itching).      metoprolol tartrate (LOPRESSOR) 25 MG tablet Take 1 tablet (25 mg total) by mouth 2 (two) times daily. 60 tablet 2   omeprazole (PRILOSEC) 40 MG capsule Take 40 mg by mouth daily.     oxyCODONE-acetaminophen (PERCOCET) 5-325 MG tablet Take 1 tablet by mouth every 6 (six) hours as needed for severe pain. (Patient not taking: Reported on 07/07/2019) 20 tablet 0   No current facility-administered medications for this visit.     REVIEW OF SYSTEMS:  [X]  denotes  positive finding, [ ]  denotes negative finding Cardiac  Comments:  Chest pain or chest pressure:    Shortness of breath upon exertion:    Short of breath when lying flat:    Irregular heart rhythm:        Vascular    Pain in calf, thigh, or hip brought on by ambulation:    Pain in feet at night that wakes you up from your sleep:     Blood clot in your veins:    Leg swelling:         Pulmonary    Oxygen at home:    Productive cough:     Wheezing:         Neurologic    Sudden weakness in arms or legs:     Sudden numbness in arms or legs:     Sudden onset of difficulty speaking or slurred speech:     Temporary loss of vision in one eye:     Problems with dizziness:         Gastrointestinal    Blood in stool:     Vomited blood:         Genitourinary    Burning when urinating:     Blood in urine:        Psychiatric    Major depression:         Hematologic    Bleeding problems:    Problems with blood clotting too easily:        Skin    Rashes or ulcers:        Constitutional    Fever or chills:      PHYSICAL EXAM: Vitals:   07/07/19 0819 07/07/19 0824  BP: (!) 195/85 (!) 175/83  Pulse: (!) 50 (!) 50  Resp: 16   Temp: (!) 97.1 F (36.2 C)   TempSrc: Temporal   SpO2: 100%   Weight: 150 lb (68 kg)     GENERAL: The patient is a well-nourished male, in no acute distress. The vital signs are documented above. CARDIAC: There is a regular rate and rhythm.  VASCULAR:  Well healed bilateral groins incisions Palpable femoral pulses bilaterally Brisk DP signals bilaterally No tissue loss in lower extremities PULMONARY: There is good air exchange bilaterally without wheezing or rales. ABDOMEN: Soft and non-tender. No rebound.  Well healed midline incision.   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  Doing remarkably well today and all of his incisions have healed without issue.  He has bounding femoral pulses in both groins.  He has very brisk dorsalis pedis signals by doppler bilaterally.  I am hopeful that some of his confusion will continue to improve.  Alert and oriented today.  I will see him back in 9 months with repeat ABIs.  Discussed with his wife to call with questions or concerns.   Marty Heck, MD [facility] [place] Office: [phone] Pager: [phone]

## 2020-01-11 DEATH — deceased

## 2020-05-10 IMAGING — DX PORTABLE CHEST - 1 VIEW
1 series · 1 of 1 positions shown · non-contrast
Comparison: 08/24/2012

CLINICAL DATA: Status post abdominal aortic aneurysm repair.

EXAM:
PORTABLE CHEST 1 VIEW

[chest]
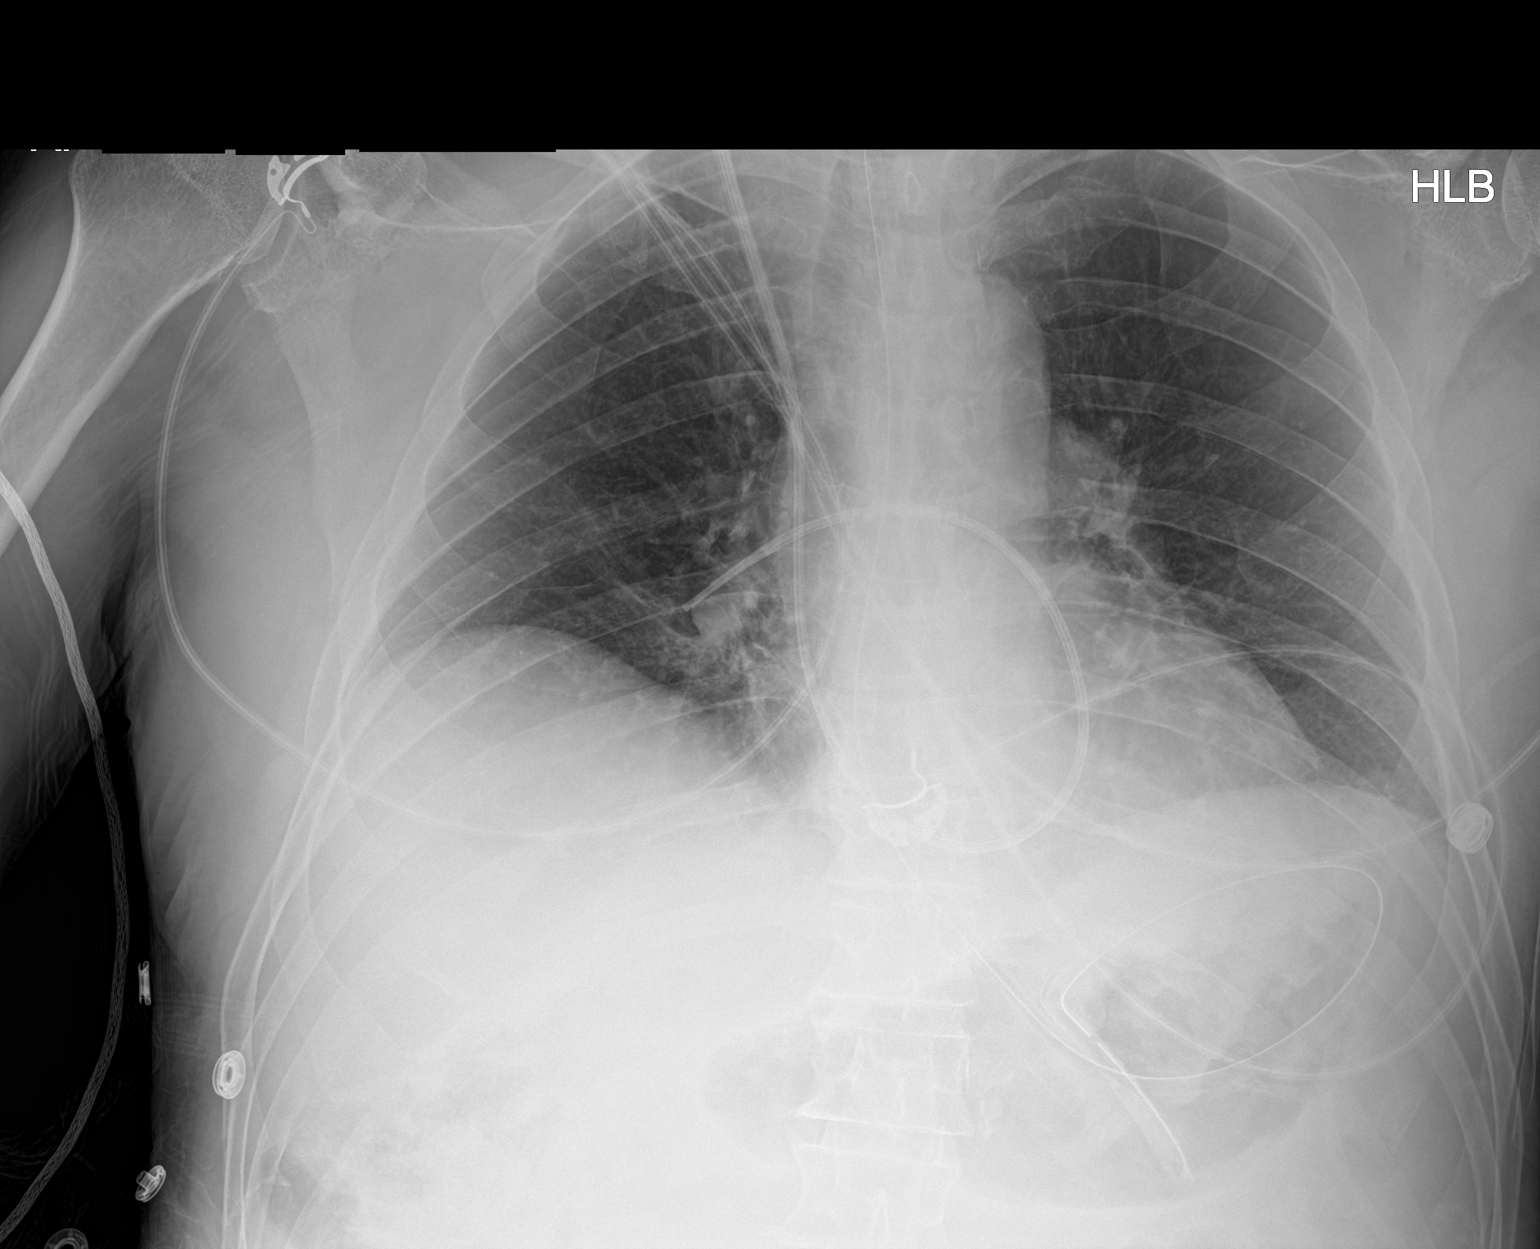

[1 of 1 positions shown; findings below may reference images not displayed]

FINDINGS: A right jugular Swan-Ganz catheter terminates in the region of the
distal inter lobar artery or right lower lobe pulmonary artery. An
enteric tube terminates over the gastric body. The cardiomediastinal
silhouette is within normal limits. The lungs are hypoinflated
without evidence of airspace consolidation, edema, sizable pleural
effusion, pneumothorax. No acute osseous abnormality is seen.
IMPRESSION: 1. Support devices as above.
2. Hypoinflation without evidence of acute airspace disease.

## 2020-05-10 IMAGING — CR OR LOCAL ABDOMEN
2 series · 2 of 2 positions shown · non-contrast
Comparison: 11/27/2018.

CLINICAL DATA: Incorrect needle count.

EXAM:
OR LOCAL ABDOMEN

[AP (1 of 2)]
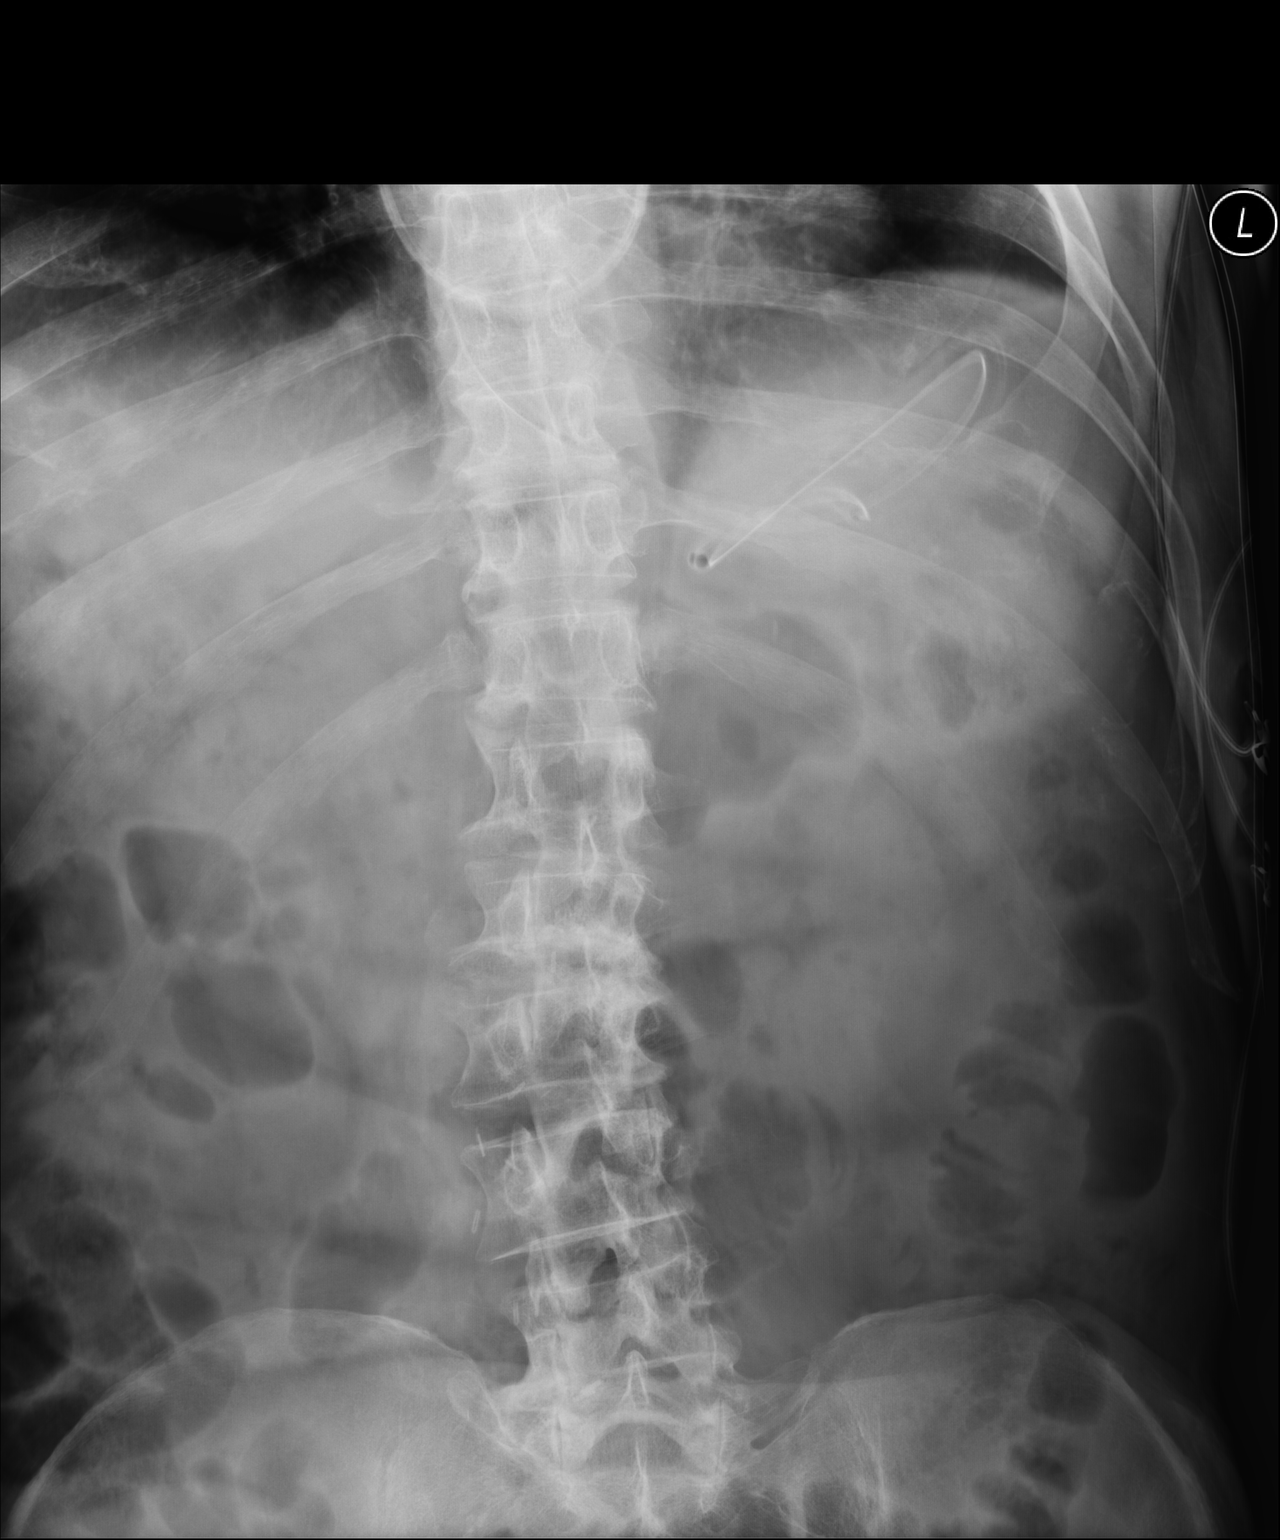

[AP (2 of 2)]
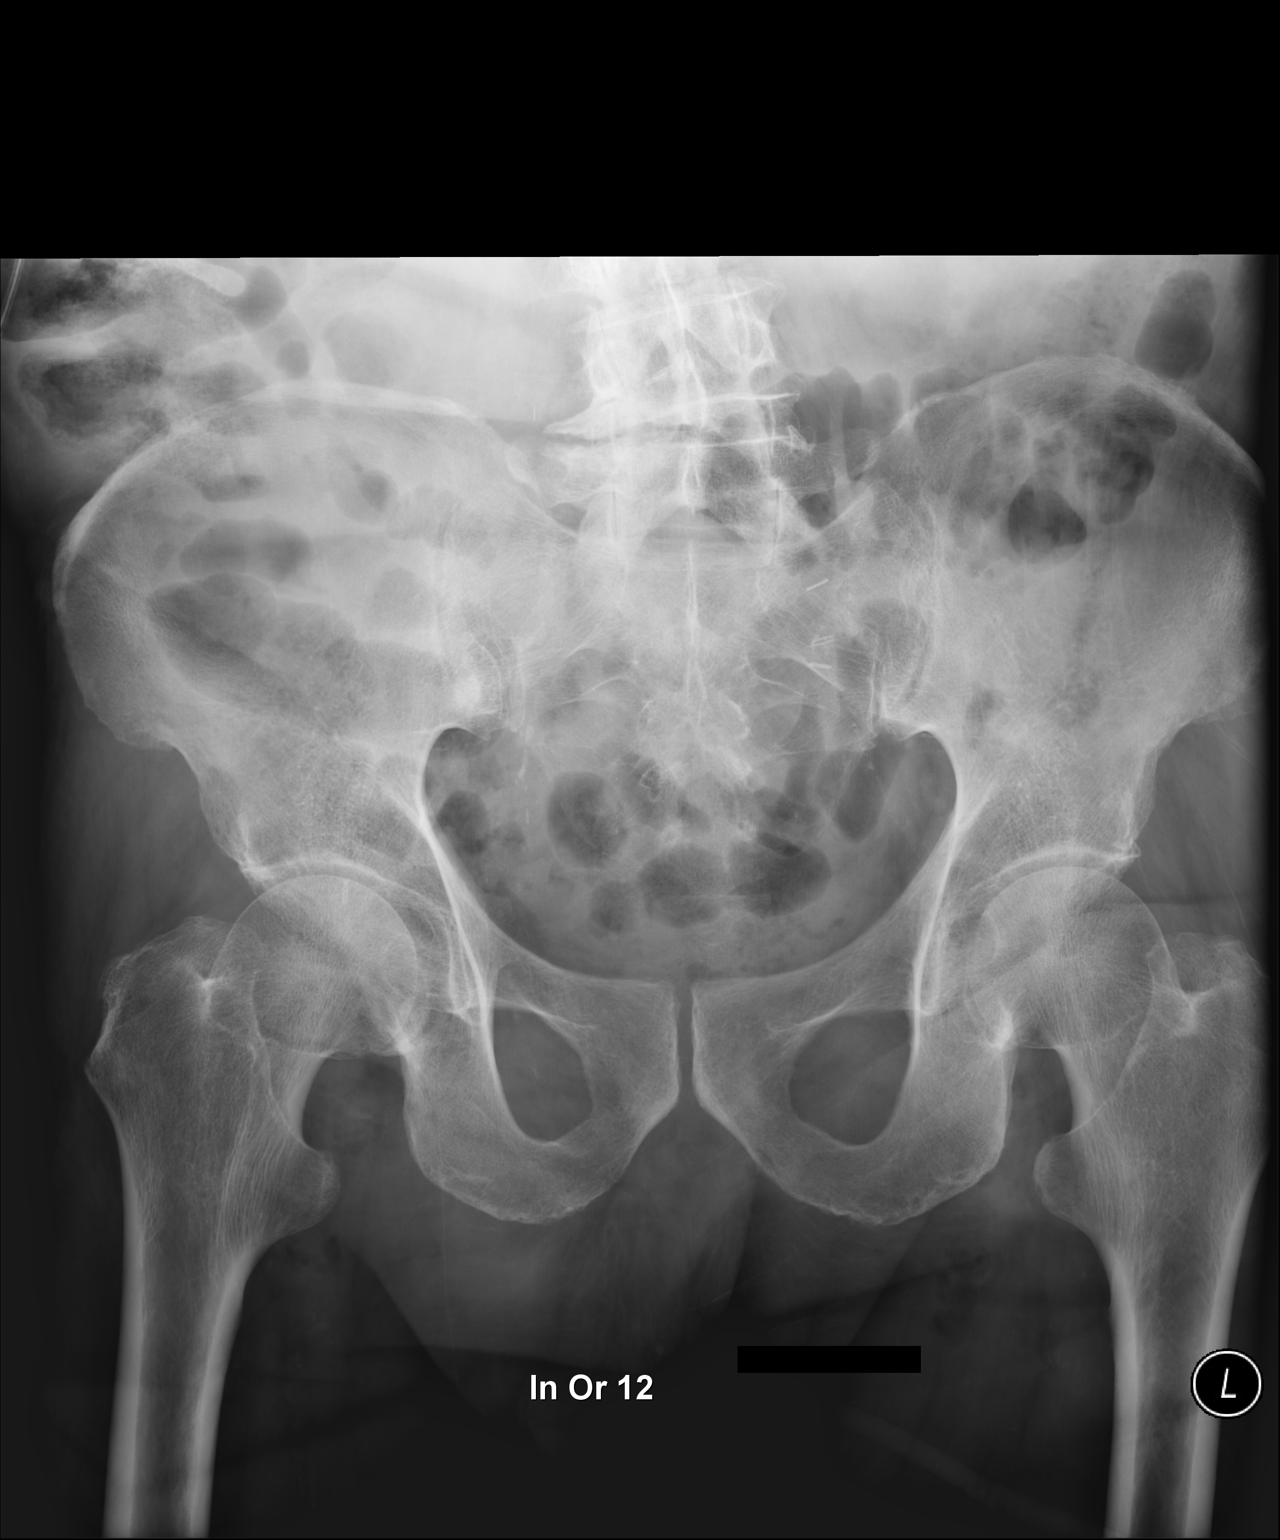

[2 of 2 positions shown; findings below may reference images not displayed]

FINDINGS: NG tube noted coiled stomach. Multiple tiny linear metallic
densities are noted throughout the pelvis. These have the appearance
of clips as opposed to a suture needle. No bowel distention. No free
air.
IMPRESSION: Surgical clips noted in the abdomen and pelvis. No definite retained
suture needle is identified. Report phoned to the OR at the time of
the procedure.

## 2020-05-11 IMAGING — DX PORTABLE CHEST - 1 VIEW
1 series · 1 of 1 positions shown · non-contrast
Comparison: 06/08/2019

CLINICAL DATA: AAA repair

EXAM:
PORTABLE CHEST 1 VIEW

[chest ap]
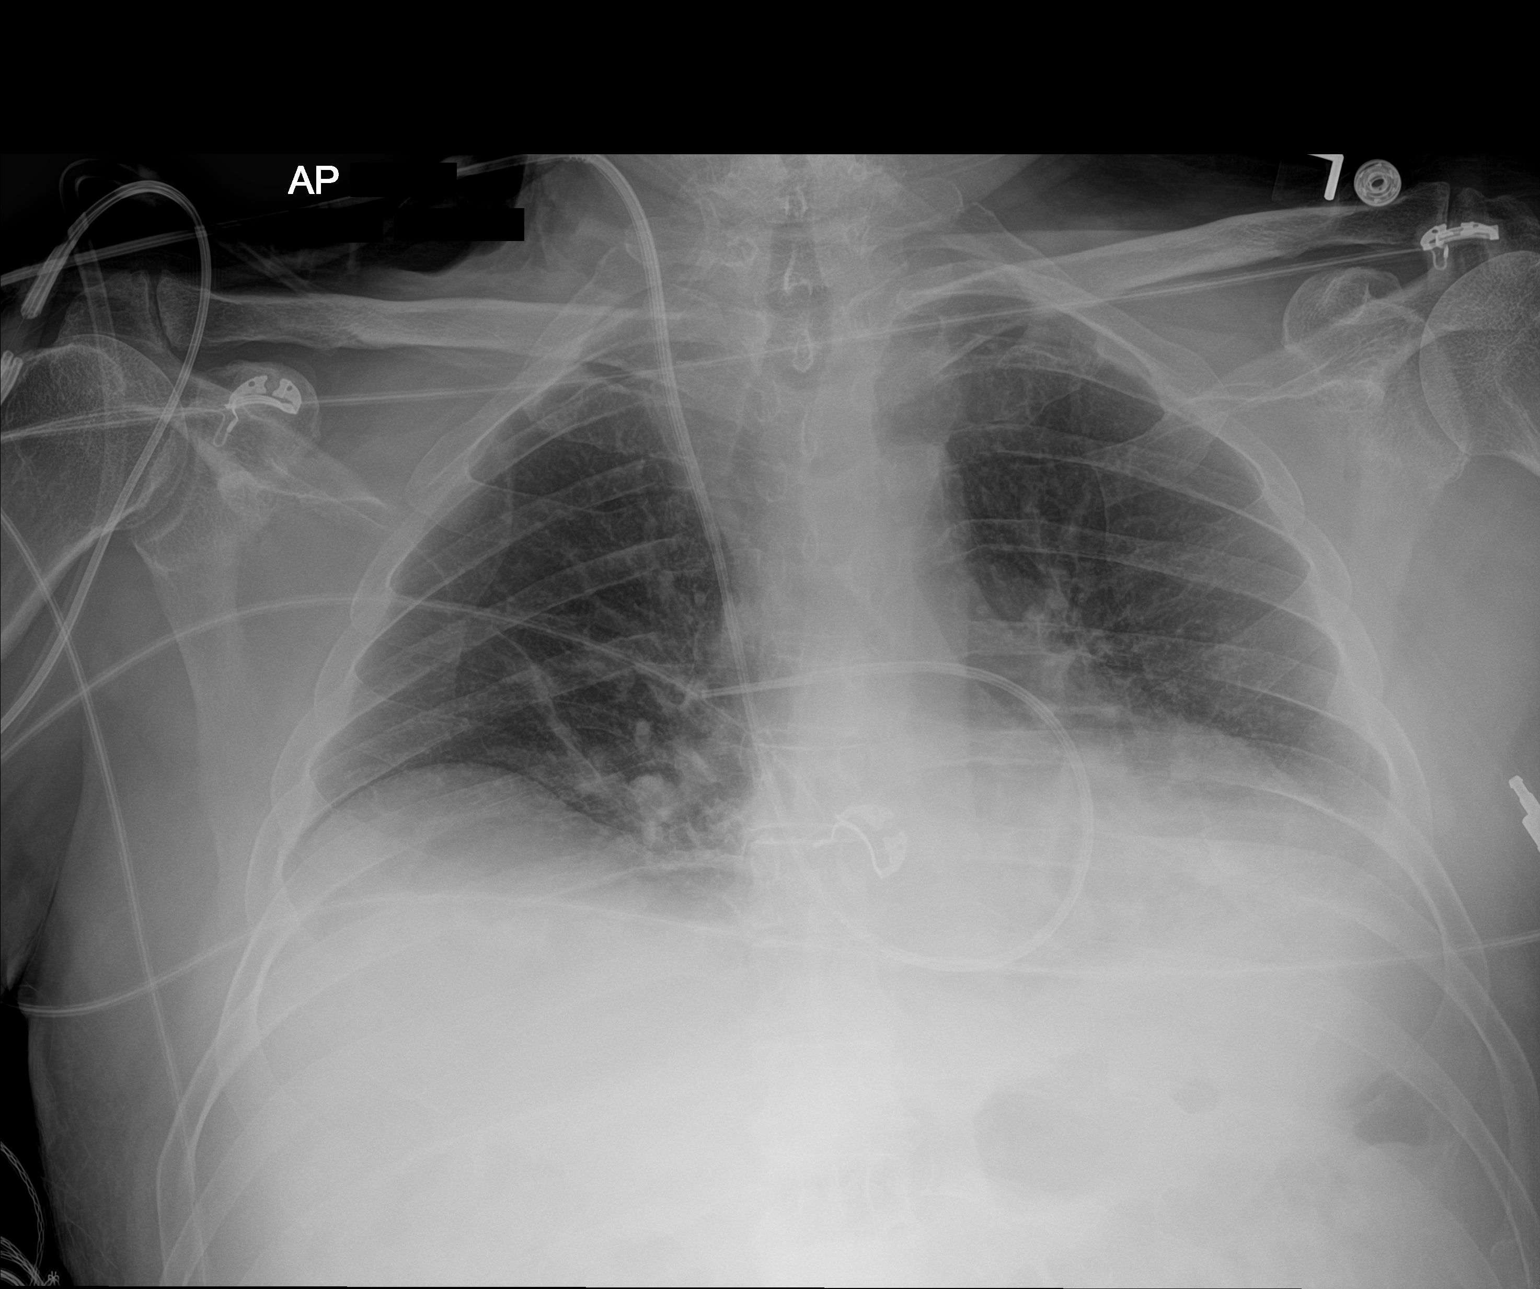

[1 of 1 positions shown; findings below may reference images not displayed]

FINDINGS: NG tube removed. Swan-Ganz catheter tip right lower pulmonary artery
unchanged.

Increase in bibasilar atelectasis with decreased lung volume.
Negative for heart failure or edema. Small left effusion.
IMPRESSION: Decreased lung volume with increase in bibasilar atelectasis
compared with yesterday.
